# Patient Record
Sex: Male | Born: 1975 | Race: Black or African American | Hispanic: No | Marital: Married | Smoking: Never smoker
Health system: Southern US, Community
[De-identification: ages and names within clinical notes are randomized; demographics above are authoritative.]

## PROBLEM LIST (undated history)

## (undated) DIAGNOSIS — I5020 Unspecified systolic (congestive) heart failure: Secondary | ICD-10-CM

## (undated) DIAGNOSIS — J9600 Acute respiratory failure, unspecified whether with hypoxia or hypercapnia: Secondary | ICD-10-CM

## (undated) DIAGNOSIS — R55 Syncope and collapse: Secondary | ICD-10-CM

## (undated) DIAGNOSIS — I517 Cardiomegaly: Secondary | ICD-10-CM

## (undated) DIAGNOSIS — I340 Nonrheumatic mitral (valve) insufficiency: Secondary | ICD-10-CM

## (undated) DIAGNOSIS — I2510 Atherosclerotic heart disease of native coronary artery without angina pectoris: Secondary | ICD-10-CM

## (undated) DIAGNOSIS — Z951 Presence of aortocoronary bypass graft: Secondary | ICD-10-CM

## (undated) DIAGNOSIS — I255 Ischemic cardiomyopathy: Secondary | ICD-10-CM

## (undated) HISTORY — DX: Cardiomegaly: I51.7

## (undated) HISTORY — DX: Acute respiratory failure, unspecified whether with hypoxia or hypercapnia: J96.00

## (undated) HISTORY — PX: TONSILLECTOMY: SUR1361

## (undated) HISTORY — DX: Nonrheumatic mitral (valve) insufficiency: I34.0

---

## 2009-10-20 ENCOUNTER — Inpatient Hospital Stay (HOSPITAL_COMMUNITY): Admission: EM | Admit: 2009-10-20 | Discharge: 2009-10-20 | Payer: Self-pay | Admitting: Emergency Medicine

## 2010-10-06 LAB — BASIC METABOLIC PANEL
BUN: 10 mg/dL (ref 6–23)
CO2: 27 mEq/L (ref 19–32)
Chloride: 102 mEq/L (ref 96–112)
Creatinine, Ser: 1.14 mg/dL (ref 0.4–1.5)
Glucose, Bld: 118 mg/dL — ABNORMAL HIGH (ref 70–99)
Potassium: 4 mEq/L (ref 3.5–5.1)

## 2010-10-06 LAB — DIFFERENTIAL
Basophils Absolute: 0.1 10*3/uL (ref 0.0–0.1)
Basophils Relative: 1 % (ref 0–1)
Eosinophils Absolute: 0.4 10*3/uL (ref 0.0–0.7)
Eosinophils Relative: 3 % (ref 0–5)
Lymphs Abs: 4.9 10*3/uL — ABNORMAL HIGH (ref 0.7–4.0)
Neutrophils Relative %: 52 % (ref 43–77)

## 2010-10-06 LAB — CBC
HCT: 46.4 % (ref 39.0–52.0)
MCHC: 33 g/dL (ref 30.0–36.0)
MCV: 81.1 fL (ref 78.0–100.0)
Platelets: 323 10*3/uL (ref 150–400)
RDW: 14.1 % (ref 11.5–15.5)
WBC: 13.5 10*3/uL — ABNORMAL HIGH (ref 4.0–10.5)

## 2017-07-28 DIAGNOSIS — Z951 Presence of aortocoronary bypass graft: Secondary | ICD-10-CM

## 2017-07-28 HISTORY — DX: Presence of aortocoronary bypass graft: Z95.1

## 2017-07-28 HISTORY — PX: CORONARY ARTERY BYPASS GRAFT: SHX141

## 2017-08-16 ENCOUNTER — Encounter: Payer: Self-pay | Admitting: Interventional Cardiology

## 2017-08-17 ENCOUNTER — Ambulatory Visit (INDEPENDENT_AMBULATORY_CARE_PROVIDER_SITE_OTHER): Payer: Managed Care, Other (non HMO) | Admitting: Interventional Cardiology

## 2017-08-17 ENCOUNTER — Encounter: Payer: Self-pay | Admitting: Interventional Cardiology

## 2017-08-17 ENCOUNTER — Encounter (INDEPENDENT_AMBULATORY_CARE_PROVIDER_SITE_OTHER): Payer: Self-pay

## 2017-08-17 VITALS — BP 90/68 | HR 101 | Ht 74.0 in | Wt 219.2 lb

## 2017-08-17 DIAGNOSIS — I255 Ischemic cardiomyopathy: Secondary | ICD-10-CM

## 2017-08-17 DIAGNOSIS — E785 Hyperlipidemia, unspecified: Secondary | ICD-10-CM

## 2017-08-17 DIAGNOSIS — D649 Anemia, unspecified: Secondary | ICD-10-CM | POA: Diagnosis not present

## 2017-08-17 DIAGNOSIS — I5022 Chronic systolic (congestive) heart failure: Secondary | ICD-10-CM

## 2017-08-17 DIAGNOSIS — I25119 Atherosclerotic heart disease of native coronary artery with unspecified angina pectoris: Secondary | ICD-10-CM

## 2017-08-17 DIAGNOSIS — I998 Other disorder of circulatory system: Secondary | ICD-10-CM | POA: Diagnosis not present

## 2017-08-17 MED ORDER — SACUBITRIL-VALSARTAN 24-26 MG PO TABS: 1.0000 | ORAL_TABLET | Freq: Two times a day (BID) | ORAL | 11 refills | Status: DC

## 2017-08-17 NOTE — Progress Notes (Addendum)
Cardiology Office Note    Date:  08/18/2017   ID:  Brad Lee, DOB 01/03/1976, MRN [id number]  PCP:  Henrine Screwshacker, Robert, MD  Cardiologist: Lesleigh NoeHenry W Smith III, MD   Chief Complaint  Patient presents with  . Coronary Artery Disease    Recent acute coronary syndrome [name]    History of Present Illness:  Brad Lee is a 42 y.o. 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  His primary physician is Dr. Henrine Screwsobert Thacker.  The patient's up for an upper respiratory illness in late December.  Was prescribed a Z-Pak and several days later began experiencing "indigestion".  He experienced severe discomfort associated with nausea, vomiting, and diaphoresis on the morning of admission to the hospital.  He contacted EMS and was [facility].  He was felt to be suffering an ST elevation myocardial infarction and was taken directly to the catheterization laboratory where the LAD was found to be totally occluded and there was high-grade disease noted in the circumflex and right coronary.  Attempts to open the LAD were unsuccessful and subsequently felt to be chronically totally occluded (CTO).  High-grade obstruction in the very distal RCA before the PDA was noted.  The RCA was felt to supply collaterals to the anterior wall.  Angioplasty using a 1.2 mm balloon was performed on the tight distal RCA stenosis with improvement in symptoms.  A stent was not placed.  Later that day the patient had recurrence of symptoms, nausea, and dyspnea.  He was taken back to the Cath Lab where anatomy was unchanged and subsequently a balloon pump was placed.  At this point it was noted that his cardiac markers revealed a troponin greater than 65.  He was seen by cardiac surgery who felt he should be cooled off for 72  hours before coronary bypass grafting.  CABG was performed on 07/28/2017 with LIMA to LAD, SVG to diagonal, SVG to OM, and sequential SVG to PDA and PL.  A cutdown was performed on the left femoral artery for removal of the previously placed balloon pump and reinsertion to allow weaning from bypass.  Recent LVEF by echo performed on 07/29/17 was 45%.  A transesophageal echo performed on 08/06/2017 to rule out vegetations revealed an EF of 30-35%.  He is currently taking furosemide 20 mg/day, metoprolol succinate 50 mg/day, colchicine 0.6 mg/day, aspirin 81 mg/day, lisinopril 2.5 mg/day along with Zyrtec 10 mg/day, and famotidine 10 mg ingestion.  Medication list as outlined below is incorrect and will be updated.  He presents today to establish for longitudinal cardiology care and is accompanied by his wife.  He denies dyspnea.  He denies chest pain.  He has concerns about his left thigh.  He denies chest/sternal clicking.  There is no peripheral edema.  He has one pillow orthopnea.  He has not had palpitations  Past Medical History:  Diagnosis Date  . LVH (left ventricular hypertrophy)   . Mild bibasilar atelectasis    and/or pneumonia  . Mitral regurgitation   . Respiratory failure, acute (HCC)   . 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 STEMI (ST elevation myocardial infarction) (HCC) 07/25/2017   [place] [facility] [place]    Past Surgical History:  Procedure Laterality Date  . CORONARY ARTERY BYPASS GRAFT  07/28/2017  .  TONSILLECTOMY      Current Medications: No facility-administered medications prior to visit.    Outpatient Medications Prior to Visit  Medication Sig Dispense Refill  . albuterol (PROVENTIL HFA;VENTOLIN HFA) 108 (90 Base) MCG/ACT inhaler Inhale 2 puffs into the lungs every 6 (six) hours as needed for wheezing or shortness of breath.    Marland Kitchen atorvastatin (LIPITOR) 40 MG tablet Take 40 mg by mouth daily.    . calcium carbonate (TUMS - DOSED IN MG ELEMENTAL CALCIUM) 500 MG chewable  tablet Chew 1 tablet by mouth daily as needed for indigestion or heartburn.     . colchicine 0.6 MG tablet Take 0.6 mg by mouth daily.    . famotidine (PEPCID) 10 MG tablet Take 10 mg by mouth daily as needed for heartburn or indigestion.     Marland Kitchen aspirin 81 MG tablet Take 81 mg by mouth daily.    Marland Kitchen lisinopril (PRINIVIL,ZESTRIL) 2.5 MG tablet Take 2.5 mg by mouth daily.    . Metoprolol-Hydrochlorothiazide 50-12.5 MG TB24 Take 1 tablet by mouth daily.     . cetirizine (ZYRTEC) 10 MG tablet Take 10 mg by mouth daily as needed for allergies.     . fluticasone (FLONASE) 50 MCG/ACT nasal spray Place 1 spray into both nostrils daily as needed for allergies.     . Multiple Vitamin (MULTIVITAMIN) tablet Take 1 tablet by mouth daily.    . benzonatate (TESSALON) 200 MG capsule Take 200 mg by mouth 3 (three) times daily.       Allergies:   Augmentin [amoxicillin-pot clavulanate]   Social History   Socioeconomic History  . Marital status: Married    Spouse name: None  . Number of children: 0  . Years of education: None  . Highest education level: None  Social Needs  . Financial resource strain: None  . Food insecurity - worry: None  . Food insecurity - inability: None  . Transportation needs - medical: None  . Transportation needs - non-medical: None  Occupational History  . Occupation: OPERATIONS MANAGER  Tobacco Use  . Smoking status: Never Smoker  . Smokeless tobacco: Never Used  Substance and Sexual Activity  . Alcohol use: No    Frequency: Never  . Drug use: No  . Sexual activity: Not Currently  Other Topics Concern  . None  Social History Narrative  . None     Family History:  The patient's family history includes Healthy in his father and mother.   ROS:   Please see the history of present illness.    Sinus trouble, some difficulty with vision since surgery, unexplained weight loss, wrist appetite, fatigue, but otherwise no complaints.  He has not noticed any unusual drainage  from the left lower extremity vein graft harvest site.  He still has some drainage from the mediastinal to site which still has a Tegaderm bandage in place that is quite and clean. All other systems reviewed and are negative.   PHYSICAL EXAM:   VS:  BP 90/68   Pulse (!) 101   Ht 6\' 2"  (1.88 m)   Wt 219 lb 3.2 oz (99.4 kg)   BMI 28.14 kg/m    Asymmetric blood pressure: 122/82 mmHg right arm 90/68 mmHg left arm GEN: Well nourished, well developed, in no acute distress  HEENT: normal  Neck: no JVD, carotid bruits, or masses Cardiac: Relatively rapid, regular heart rate.  RRR; no murmurs, rubs, or edema.  A summation gallop is audible. Respiratory:  clear to auscultation bilaterally,  normal work of breathing GI: soft, nontender, nondistended, + BS MS: no deformity or atrophy  Skin: warm and dry, no rash Neuro:  Alert and Oriented x 3, Strength and sensation are intact Psych: euthymic mood, full affect  Wt Readings from Last 3 Encounters:  08/18/17 219 lb (99.3 kg)  08/17/17 219 lb 3.2 oz (99.4 kg)      Studies/Labs Reviewed:   EKG:  EKG sinus tachycardia at 101 bpm, biatrial abnormality, evidence of a large anterolateral Q wave infarction involving V1 through V5 as well as 1 and aVL.  When [facility] ST segment elevation 2 3 aVF V4 through V6 has resolved.  Q waves are unchanged  Recent Labs: 08/18/2017: ALT 30; B Natriuretic Peptide 201.0; BUN 12; Creatinine, Ser 1.07; Hemoglobin 13.4; Magnesium 2.1; Platelets 335; Potassium 4.0; Sodium 137   Lipid Panel No results found for: CHOL, TRIG, HDL, CHOLHDL, VLDL, LDLCALC, LDLDIRECT  Additional studies/ records that were reviewed today include:  [facility] from [facility]:  Carotid duplex and Doppler study preop for surgery: No significant obstruction/less than 50% bilateral extracranial carotid arteries.  Echocardiogram 07/25/2017 EF 50% with  anterolateral and apical hypokinesis.    Echocardiogram 07/27/2017 with EF 55-60% hypokinesis of the basal apical walls.    Echocardiogram 07/29/17 EF 40-45% with apical and apical hypokinesis.  Cardiac cath 07/25/2017: Severe multivessel coronary disease with 70% ostial and chronic total occlusion of the mid LAD.  No visible diagonals were noted.  Circumflex is felt to be small after the first obtuse marginal origin.  First obtuse marginal 90% ostial proximal narrowing and there is 90% obstruction in the inferior limb after bifurcating RCA is diffusely diseased 50% dated 70% RV marginal, and distal 99% RCA proximal to the PDA and LV branch.  Normal left ventricular ejection fraction of 50-60% with LVEDP 24 mmHg.  1.2 mm balloon angioplasty of the distal right coronary was performed.  Coronary bypass grafting on 07/28/2017: LIMA to LAD, SVG to diagonal, SVG to OM1, sequential SVG to PDA and posterolateral.  Also mention of left groin exploration by cutdown for removal of clotted/static intra-aortic balloon pump and sheath with placement of new sheath and balloon pump through open groin.  Transesophageal echocardiography performed on 08/10/2017 to rule out valvular vegetation demonstrated EF 30-35% with diffuse hypokinesis, no other significant findings were mentioned.  08/14/17 labs drawn at [facility] since return to [facility] revealed a low iron saturation sent with total iron of 42, mildly elevated alkaline phosphatase at 159.  The BUN and creatinine 13 and 1.19 with normal blood sugar.  Potassium 5.1.  Hemoglobin 13.5.  Platelet count 593,000.    ASSESSMENT:    1. Coronary artery disease involving native coronary artery of native heart with angina pectoris (HCC)   2. Ischemic cardiomyopathy   3. Asymmetric blood pressures   4. Chronic systolic heart failure (HCC)   5. Hyperlipidemia with target LDL less than 70   6. Postoperative anemia      PLAN:  In order of problems listed  above:  1. Multivessel disease was revascularized with coronary bypass grafting x5 as outlined above.  No ongoing symptoms of angina. 2. Most recent LVEF is 30-35% obtained when a transesophageal echocardiogram was done on 08/06/2017.  No valvular abnormalities were noted at that time.  Stop wearing a LifeVest because of frequent warnings that shocked. 3. Medication adjustment should be based upon right arm blood pressures.  Will  need to have vascular ultrasound evaluation of left subclavian to exclude obstructive disease or trauma related to prior intra-aortic balloon pump insertion recently. 4. LV dysfunction is related to ischemic heart disease.  Upon presentation with acute coronary syndrome EF was greater than 50%.  The resultant EF follows multivessel coronary bypass grafting.  Will restructure the patient's current medication regimen by continuing metoprolol succinate 50 mg/day, we will discontinue lisinopril, we will start Entresto 24/26 mg twice daily in 24 hours.  Furosemide 20 mg/day will be continued.  A comprehensive metabolic panel will be obtained in 1 week after initiating therapy.  Lisinopril was discontinued as of last evening, nearly 24 hours ago. 5. Continue Lipitor 40 mg/day.  On return a comprehensive metabolic panel and lipid panel along with hemoglobin A1c will be obtained. 6. Based upon laboratory data drawn by Dr. Abigail Miyamoto on 08/14/2017, anemia is no longer a significant problem.  Hemoglobin was greater than 13.  Very complex situation in this young gentleman.  I am very concerned about the reduction in LVEF.  We will alter the medical regimen to exclude a strong guideline based regimen to preserve LV function.  On next visit, colchicine will likely be discontinued.  There is no clinical evidence of pericarditis.  The patient will start the cardiac rehab program at [facility] within the next week.  Depending upon how he responds to medical therapy, may need to have an  advanced heart failure clinic evaluation.  We need to exclude risk factors that could have cause premature coronary disease at such a young age.  If LVEF remains less than 35% after 90 days of guideline based optimized heart failure therapy, may need to consider defibrillator.  Medication Adjustments/Labs and Tests Ordered: Current medicines are reviewed at length with the patient today.  Concerns regarding medicines are outlined above.  Medication changes, Labs and Tests ordered today are listed in the Patient Instructions below. Patient Instructions  Medication Instructions:  1) DISCONTINUE Lisinopril 2) Tomorrow night, start Entresto 24/26mg  twice daily  Labwork: Your physician recommends that you return for lab work in: 1 week (Lipid, CMET, A1C)   Testing/Procedures: None  Follow-Up: Your physician recommends that you schedule a follow-up appointment in: 1 week with Dr. Katrinka Blazing.    Any Other Special Instructions Will Be Listed Below (If Applicable).     If you need a refill on your cardiac medications before your next appointment, please call your pharmacy.      Signed, Lesleigh Noe, MD  08/18/2017 1:33 PM    [facility] [address] [place], [place], [place]  [address] Phone: [phone]; Fax: [phone]

## 2017-08-17 NOTE — Patient Instructions (Signed)
Medication Instructions:  1) DISCONTINUE Lisinopril 2) Tomorrow night, start Entresto 24/26mg  twice daily  Labwork: Your physician recommends that you return for lab work in: 1 week (Lipid, CMET, A1C)   Testing/Procedures: None  Follow-Up: Your physician recommends that you schedule a follow-up appointment in: 1 week with Dr. Katrinka BlazingSmith.    Any Other Special Instructions Will Be Listed Below (If Applicable).     If you need a refill on your cardiac medications before your next appointment, please call your pharmacy.

## 2017-08-18 ENCOUNTER — Other Ambulatory Visit: Payer: Self-pay

## 2017-08-18 ENCOUNTER — Encounter (HOSPITAL_COMMUNITY): Payer: Self-pay | Admitting: Medical

## 2017-08-18 ENCOUNTER — Emergency Department (HOSPITAL_COMMUNITY): Payer: Managed Care, Other (non HMO)

## 2017-08-18 ENCOUNTER — Telehealth: Payer: Self-pay | Admitting: Interventional Cardiology

## 2017-08-18 ENCOUNTER — Other Ambulatory Visit: Payer: Managed Care, Other (non HMO)

## 2017-08-18 ENCOUNTER — Observation Stay (HOSPITAL_COMMUNITY)
Admission: EM | Admit: 2017-08-18 | Discharge: 2017-08-19 | Disposition: A | Discharge status: Home / Self Care | Payer: Managed Care, Other (non HMO) | Attending: Cardiology | Admitting: Cardiology

## 2017-08-18 DIAGNOSIS — E785 Hyperlipidemia, unspecified: Secondary | ICD-10-CM | POA: Diagnosis not present

## 2017-08-18 DIAGNOSIS — Z951 Presence of aortocoronary bypass graft: Secondary | ICD-10-CM | POA: Diagnosis not present

## 2017-08-18 DIAGNOSIS — I25709 Atherosclerosis of coronary artery bypass graft(s), unspecified, with unspecified angina pectoris: Secondary | ICD-10-CM | POA: Insufficient documentation

## 2017-08-18 DIAGNOSIS — I5022 Chronic systolic (congestive) heart failure: Secondary | ICD-10-CM | POA: Diagnosis not present

## 2017-08-18 DIAGNOSIS — I214 Non-ST elevation (NSTEMI) myocardial infarction: Secondary | ICD-10-CM | POA: Diagnosis not present

## 2017-08-18 DIAGNOSIS — D649 Anemia, unspecified: Secondary | ICD-10-CM | POA: Insufficient documentation

## 2017-08-18 DIAGNOSIS — R740 Nonspecific elevation of levels of transaminase and lactic acid dehydrogenase [LDH]: Secondary | ICD-10-CM | POA: Insufficient documentation

## 2017-08-18 DIAGNOSIS — R61 Generalized hyperhidrosis: Secondary | ICD-10-CM | POA: Insufficient documentation

## 2017-08-18 DIAGNOSIS — R55 Syncope and collapse: Secondary | ICD-10-CM | POA: Diagnosis not present

## 2017-08-18 DIAGNOSIS — I4589 Other specified conduction disorders: Secondary | ICD-10-CM | POA: Diagnosis not present

## 2017-08-18 DIAGNOSIS — Z7982 Long term (current) use of aspirin: Secondary | ICD-10-CM | POA: Diagnosis not present

## 2017-08-18 DIAGNOSIS — I255 Ischemic cardiomyopathy: Secondary | ICD-10-CM | POA: Insufficient documentation

## 2017-08-18 DIAGNOSIS — I959 Hypotension, unspecified: Secondary | ICD-10-CM

## 2017-08-18 DIAGNOSIS — Z79899 Other long term (current) drug therapy: Secondary | ICD-10-CM | POA: Insufficient documentation

## 2017-08-18 DIAGNOSIS — D72829 Elevated white blood cell count, unspecified: Secondary | ICD-10-CM | POA: Diagnosis not present

## 2017-08-18 DIAGNOSIS — I5020 Unspecified systolic (congestive) heart failure: Secondary | ICD-10-CM | POA: Insufficient documentation

## 2017-08-18 DIAGNOSIS — I2510 Atherosclerotic heart disease of native coronary artery without angina pectoris: Secondary | ICD-10-CM | POA: Insufficient documentation

## 2017-08-18 DIAGNOSIS — I5042 Chronic combined systolic (congestive) and diastolic (congestive) heart failure: Secondary | ICD-10-CM | POA: Diagnosis present

## 2017-08-18 DIAGNOSIS — I998 Other disorder of circulatory system: Secondary | ICD-10-CM | POA: Insufficient documentation

## 2017-08-18 HISTORY — DX: Unspecified systolic (congestive) heart failure: I50.20

## 2017-08-18 HISTORY — DX: Atherosclerotic heart disease of native coronary artery without angina pectoris: I25.10

## 2017-08-18 HISTORY — DX: Presence of aortocoronary bypass graft: Z95.1

## 2017-08-18 HISTORY — DX: Ischemic cardiomyopathy: I25.5

## 2017-08-18 HISTORY — DX: Syncope and collapse: R55

## 2017-08-18 LAB — I-STAT CG4 LACTIC ACID, ED
LACTIC ACID, VENOUS: 3.05 mmol/L — AB (ref 0.5–1.9)
Lactic Acid, Venous: 2.31 mmol/L (ref 0.5–1.9)
Lactic Acid, Venous: 2.65 mmol/L (ref 0.5–1.9)

## 2017-08-18 LAB — COMPREHENSIVE METABOLIC PANEL
ALT: 30 U/L (ref 17–63)
AST: 31 U/L (ref 15–41)
Albumin: 3.6 g/dL (ref 3.5–5.0)
Alkaline Phosphatase: 148 U/L — ABNORMAL HIGH (ref 38–126)
Anion gap: 14 (ref 5–15)
BILIRUBIN TOTAL: 0.7 mg/dL (ref 0.3–1.2)
BUN: 11 mg/dL (ref 6–20)
CO2: 19 mmol/L — ABNORMAL LOW (ref 22–32)
CREATININE: 1.17 mg/dL (ref 0.61–1.24)
Calcium: 9.3 mg/dL (ref 8.9–10.3)
Chloride: 102 mmol/L (ref 101–111)
Glucose, Bld: 112 mg/dL — ABNORMAL HIGH (ref 65–99)
POTASSIUM: 4.2 mmol/L (ref 3.5–5.1)
Sodium: 135 mmol/L (ref 135–145)
TOTAL PROTEIN: 8.1 g/dL (ref 6.5–8.1)

## 2017-08-18 LAB — CBC
HCT: 41.8 % (ref 39.0–52.0)
HEMOGLOBIN: 13.4 g/dL (ref 13.0–17.0)
MCH: 25.4 pg — AB (ref 26.0–34.0)
MCHC: 32.1 g/dL (ref 30.0–36.0)
MCV: 79.2 fL (ref 78.0–100.0)
Platelets: 335 10*3/uL (ref 150–400)
RBC: 5.28 MIL/uL (ref 4.22–5.81)
RDW: 15.6 % — ABNORMAL HIGH (ref 11.5–15.5)
WBC: 8.8 10*3/uL (ref 4.0–10.5)

## 2017-08-18 LAB — CBC WITH DIFFERENTIAL/PLATELET
BASOS ABS: 0 10*3/uL (ref 0.0–0.1)
Basophils Relative: 0 %
Eosinophils Absolute: 0.6 10*3/uL (ref 0.0–0.7)
Eosinophils Relative: 5 %
HCT: 46.5 % (ref 39.0–52.0)
Hemoglobin: 14.9 g/dL (ref 13.0–17.0)
LYMPHS ABS: 1.6 10*3/uL (ref 0.7–4.0)
Lymphocytes Relative: 14 %
MCH: 25.4 pg — ABNORMAL LOW (ref 26.0–34.0)
MCHC: 32 g/dL (ref 30.0–36.0)
MCV: 79.4 fL (ref 78.0–100.0)
MONO ABS: 0.3 10*3/uL (ref 0.1–1.0)
Monocytes Relative: 3 %
Neutro Abs: 8.9 10*3/uL — ABNORMAL HIGH (ref 1.7–7.7)
Neutrophils Relative %: 78 %
PLATELETS: ADEQUATE 10*3/uL (ref 150–400)
RBC: 5.86 MIL/uL — AB (ref 4.22–5.81)
RDW: 15.8 % — AB (ref 11.5–15.5)
WBC: 11.4 10*3/uL — AB (ref 4.0–10.5)

## 2017-08-18 LAB — I-STAT CHEM 8, ED
BUN: 12 mg/dL (ref 6–20)
Calcium, Ion: 1.04 mmol/L — ABNORMAL LOW (ref 1.15–1.40)
Chloride: 105 mmol/L (ref 101–111)
Creatinine, Ser: 1.1 mg/dL (ref 0.61–1.24)
Glucose, Bld: 116 mg/dL — ABNORMAL HIGH (ref 65–99)
HEMATOCRIT: 47 % (ref 39.0–52.0)
HEMOGLOBIN: 16 g/dL (ref 13.0–17.0)
Potassium: 4 mmol/L (ref 3.5–5.1)
SODIUM: 137 mmol/L (ref 135–145)
TCO2: 21 mmol/L — AB (ref 22–32)

## 2017-08-18 LAB — TROPONIN I
TROPONIN I: 0.06 ng/mL — AB (ref <0.03)
Troponin I: 0.06 ng/mL (ref <0.03)

## 2017-08-18 LAB — I-STAT TROPONIN, ED: TROPONIN I, POC: 0.05 ng/mL (ref 0.00–0.08)

## 2017-08-18 LAB — CREATININE, SERUM
CREATININE: 1.07 mg/dL (ref 0.61–1.24)
GFR calc Af Amer: >60 mL/min (ref >60)
GFR calc non Af Amer: >60 mL/min (ref >60)

## 2017-08-18 LAB — BRAIN NATRIURETIC PEPTIDE: B NATRIURETIC PEPTIDE 5: 201 pg/mL — AB (ref 0.0–100.0)

## 2017-08-18 LAB — MAGNESIUM: MAGNESIUM: 2.1 mg/dL (ref 1.7–2.4)

## 2017-08-18 LAB — GLUCOSE, CAPILLARY: GLUCOSE-CAPILLARY: 98 mg/dL (ref 65–99)

## 2017-08-18 MED ORDER — ENOXAPARIN SODIUM 40 MG/0.4ML ~~LOC~~ SOLN
40.0000 mg | SUBCUTANEOUS | Status: DC
  Administered 2017-08-18: 40 mg via SUBCUTANEOUS
  Filled 2017-08-18: qty 0.4

## 2017-08-18 MED ORDER — SODIUM CHLORIDE 0.9 % IV BOLUS (SEPSIS)
1000.0000 mL | Freq: Once | INTRAVENOUS | Status: AC
Start: 2017-08-18 — End: 2017-08-18
  Administered 2017-08-18: 1000 mL via INTRAVENOUS

## 2017-08-18 MED ORDER — IOPAMIDOL (ISOVUE-370) INJECTION 76%
INTRAVENOUS | Status: AC
  Administered 2017-08-18: 75 mL via INTRAVENOUS
  Filled 2017-08-18: qty 100

## 2017-08-18 MED ORDER — SODIUM CHLORIDE 0.9 % IV BOLUS (SEPSIS)
1000.0000 mL | Freq: Once | INTRAVENOUS | Status: AC
  Administered 2017-08-18: 1000 mL via INTRAVENOUS

## 2017-08-18 MED ORDER — ASPIRIN 81 MG PO CHEW
81.0000 mg | CHEWABLE_TABLET | Freq: Every day | ORAL | Status: DC
  Administered 2017-08-18 – 2017-08-19 (×2): 81 mg via ORAL
  Filled 2017-08-18 (×2): qty 1

## 2017-08-18 MED ORDER — METOPROLOL SUCCINATE ER 50 MG PO TB24
50.0000 mg | ORAL_TABLET | Freq: Every day | ORAL | Status: DC
  Administered 2017-08-18 – 2017-08-19 (×2): 50 mg via ORAL
  Filled 2017-08-18 (×3): qty 1

## 2017-08-18 MED ORDER — ATORVASTATIN CALCIUM 40 MG PO TABS: 40.0000 mg | ORAL_TABLET | Freq: Every day | ORAL | Status: DC

## 2017-08-18 MED ORDER — FLUTICASONE PROPIONATE 50 MCG/ACT NA SUSP
1.0000 | Freq: Every day | NASAL | Status: DC | PRN
  Filled 2017-08-18: qty 16

## 2017-08-18 MED ORDER — CALCIUM CARBONATE ANTACID 500 MG PO CHEW
1.0000 | CHEWABLE_TABLET | Freq: Every day | ORAL | Status: DC | PRN
  Administered 2017-08-18: 200 mg via ORAL
  Filled 2017-08-18: qty 1

## 2017-08-18 MED ORDER — ENOXAPARIN SODIUM 40 MG/0.4ML ~~LOC~~ SOLN: 40.0000 mg | SUBCUTANEOUS | Status: DC

## 2017-08-18 MED ORDER — SODIUM CHLORIDE 0.9% FLUSH
3.0000 mL | Freq: Two times a day (BID) | INTRAVENOUS | Status: DC
  Administered 2017-08-18: 3 mL via INTRAVENOUS

## 2017-08-18 MED ORDER — COLCHICINE 0.6 MG PO TABS
0.6000 mg | ORAL_TABLET | Freq: Every day | ORAL | Status: DC
  Administered 2017-08-18 – 2017-08-19 (×2): 0.6 mg via ORAL
  Filled 2017-08-18 (×2): qty 1

## 2017-08-18 MED ORDER — LORATADINE 10 MG PO TABS
10.0000 mg | ORAL_TABLET | Freq: Every day | ORAL | Status: DC
  Administered 2017-08-18 – 2017-08-19 (×2): 10 mg via ORAL
  Filled 2017-08-18 (×2): qty 1

## 2017-08-18 MED ORDER — ONE-DAILY MULTI VITAMINS PO TABS: 1.0000 | ORAL_TABLET | Freq: Every day | ORAL | Status: DC

## 2017-08-18 MED ORDER — ADULT MULTIVITAMIN W/MINERALS CH
1.0000 | ORAL_TABLET | Freq: Every day | ORAL | Status: DC
  Administered 2017-08-18: 1 via ORAL
  Filled 2017-08-18 (×2): qty 1

## 2017-08-18 MED ORDER — FAMOTIDINE 20 MG PO TABS: 10.0000 mg | ORAL_TABLET | Freq: Every day | ORAL | Status: DC | PRN

## 2017-08-18 NOTE — Consult Note (Addendum)
Cardiology Consultation:   Patient ID: Brad Lee; [id number]; 05/02/1976   Admit date: 08/18/2017 Date of Consult: 08/18/2017  Primary Care Provider: Henrine Screwshacker, Robert, MD Primary Cardiologist: Dr. Katrinka BlazingSmith (last office visit yesterday 08/17/2017) Primary Electrophysiologist:  New, Dr Elberta Fortisamnitz   Patient Profile:   Brad Lee is a 42 y.o. male with a PMH of a recent STEMI 07/25/2017 s/p CABG x5 on 07/28/2017 while on a work trip in [place] and ischemic cardiomyopathy with EF of 30-35% by TEE on 08/11/2107  who is being seen today for the evaluation of syncope at the request of Dr. Jens Somrenshaw.  History of Present Illness:   Brad Lee has been recovering well from his STEMI until this morning when he felt weak and went to go to the bathroom. After having diarrhea, he had cold sweats, chills, and nausea. He then passed out for a few seconds while on the toilet. His wife caught him so he did not fall. His wife then called 911.   EMS came and assisted him down the steps where he then sat in a chair, felt like he had to have another bowel movement, and then and passed out again for a few seconds. He was found to be hypotensive while en route to the hospital.   His blood pressure was 73/55 upon arrival to the ED and he was tachycardic. Patient was given IV fluids. EKG showed sinus tachycardia with diffuse T wave abnormalities and large Q waves in anterolateral leads. No ST elevation or depression. Troponin 0.05. BNP 201. CTA negative for PE.   **He was prescribed a LifeVest upon discharge [facility] but has not been wearing it was not functioning correctly. The alarm kept going off as if it was about to shock him. His wife called the company and she was told that the LifeVest was reporting his heart rate is in the 300s but when his wife checked his pulse it is at most 109. The company reportedly told his wife that he may not be a good candidate for the LifeVest.  He saw Dr. Katrinka BlazingSmith yesterday and was  doing well at that time. Dr. Katrinka BlazingSmith discontinued the Lisinopril and switched him [facility]26[facility]  BID. His first dose of Entresto is supposed to be tonight after the 36 hour washout period.   Prior to his STEMI, he was healthy. No history of HTN, HLD, or DM. No family history of premature CAD.   Past Medical History:  Diagnosis Date  . LVH (left ventricular hypertrophy)   . Mild bibasilar atelectasis    and/or pneumonia  . Mitral regurgitation   . Respiratory failure, acute (HCC)   . 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 STEMI (ST elevation myocardial infarction) (HCC) 07/25/2017   [place] [facility] [facility]    Past Surgical History:  Procedure Laterality Date  . CORONARY ARTERY BYPASS GRAFT  07/28/2017  . TONSILLECTOMY       Prior to Admission medications   Medication Sig Start Date End Date Taking? Authorizing Provider  albuterol (PROVENTIL HFA;VENTOLIN HFA) 108 (90 Base) MCG/ACT inhaler Inhale 2 puffs into the lungs every 6 (six) hours as needed for wheezing or shortness of breath.   Yes [provider]  aspirin 81 MG chewable tablet Chew 81 mg by mouth daily.   Yes [provider]  atorvastatin (LIPITOR) 40 MG tablet Take 40 mg by mouth daily.   Yes [provider]  calcium carbonate (TUMS - DOSED IN MG ELEMENTAL CALCIUM) 500 MG  chewable tablet Chew 1 tablet by mouth daily as needed for indigestion or heartburn.    Yes [provider]  cetirizine (ZYRTEC) 10 MG tablet Take 10 mg by mouth daily as needed for allergies.    Yes [provider]  colchicine 0.6 MG tablet Take 0.6 mg by mouth daily.   Yes [provider]  famotidine (PEPCID) 10 MG tablet Take 10 mg by mouth daily as needed for heartburn or indigestion.    Yes [provider]  fluticasone (FLONASE) 50 MCG/ACT nasal spray Place 1 spray into both nostrils daily as needed for allergies.    Yes [provider]  furosemide (LASIX) 20 MG tablet Take 20 mg by mouth  daily.   Yes [provider]  metoprolol succinate (TOPROL-XL) 50 MG 24 hr tablet Take 50 mg by mouth daily. Take with or immediately following a meal.   Yes [provider]  Multiple Vitamin (MULTIVITAMIN) tablet Take 1 tablet by mouth daily.   Yes [provider]  sacubitril-valsartan (ENTRESTO) 24-26 MG Take 1 tablet by mouth 2 (two) times daily. Patient not taking: Reported on 08/18/2017 08/17/17   Lyn Records, MD    Inpatient Medications: Scheduled Meds: . aspirin  81 mg Oral Daily  . atorvastatin  40 mg Oral q1800  . colchicine  0.6 mg Oral Daily  . enoxaparin (LOVENOX) injection  40 mg Subcutaneous Q24H  . loratadine  10 mg Oral Daily  . metoprolol succinate  50 mg Oral Daily  . multivitamin  1 tablet Oral Daily  . sodium chloride flush  3 mL Intravenous Q12H   Continuous Infusions: . sodium chloride 1,000 mL (08/18/17 1141)   PRN Meds: calcium carbonate, famotidine, fluticasone  Allergies:    Allergies  Allergen Reactions  . Augmentin [Amoxicillin-Pot Clavulanate] Other (See Comments)    Increased heart rate    Social History:   Social History   Socioeconomic History  . Marital status: Married    Spouse name: Not on file  . Number of children: 0  . Years of education: Not on file  . Highest education level: Not on file  Social Needs  . Financial resource strain: Not on file  . Food insecurity - worry: Not on file  . Food insecurity - inability: Not on file  . Transportation needs - medical: Not on file  . Transportation needs - non-medical: Not on file  Occupational History  . Occupation: OPERATIONS MANAGER  Tobacco Use  . Smoking status: Never Smoker  . Smokeless tobacco: Never Used  Substance and Sexual Activity  . Alcohol use: No    Frequency: Never  . Drug use: No  . Sexual activity: Not Currently  Other Topics Concern  . Not on file  Social History Narrative  . Not on file    Family History:   Family History    Problem Relation Age of Onset  . Healthy Mother   . Healthy Father    Family Status:  Family Status  Relation Name Status  . Mother  Alive  . Father  Alive    ROS:  Please see the history of present illness.  All other ROS reviewed and negative.     Physical Exam/Data:   Vitals:   08/18/17 1015 08/18/17 1100 08/18/17 1130 08/18/17 1200  BP: 113/80 116/82 114/81   Pulse: (!) 111 (!) 111 (!) 113 (!) 110  Resp: 17 (!) 23 16 19   Temp:      TempSrc:  SpO2: 98% 98% 95% 100%  Weight:      Height:        Intake/Output Summary (Last 24 hours) at 08/18/2017 1251 Last data filed at 08/18/2017 0919 Gross per 24 hour  Intake -  Output 500 ml  Net -500 ml   Filed Weights   08/18/17 0601  Weight: 219 lb (99.3 kg)   Body mass index is 28.12 kg/m.  General:  42 y.o. African-American male who appears well nourished, well developed, and is lying in bed in no acute distress HEENT: normal Lymph: no adenopathy Neck: no JVD Endocrine:  No thryomegaly Vascular: No carotid bruits; 4/4 extremity pulses 2+, without bruits  Cardiac:  normal S1, S2; tachycardia with a regular rhythm; no murmurs appreciated; midline scar healing well  Lungs:  clear to auscultation bilaterally, no wheezing, rhonchi or rales  Abd: soft, nontender, no hepatomegaly  Ext: no edema Musculoskeletal:  No deformities, BUE and BLE strength normal and equal Skin: warm and dry  Neuro:  CNs 2-12 intact, no focal abnormalities noted Psych:  Normal affect   EKG:  The EKG was personally reviewed and demonstrates: sinus tachycardia, LAE, diffuse T wave abnormalities, large Q waves in anterolateral leads, no STE/D, QT 387, QTC 512. Telemetry:  Telemetry was personally reviewed and demonstrates:  Sinus tachycardia  Relevant CV Studies:  [facility] from [facility]:  Carotid duplex and Doppler study preop for surgery: No significant obstruction/less than 50% bilateral extracranial  carotid arteries.  Echocardiogram1/02/2018 EF 50% with anterolateral and apical hypokinesis.   Echocardiogram 07/27/2017 with EF 55-60% hypokinesis of the basal apical walls.   Echocardiogram 07/29/17 EF 40-45% with apical and apical hypokinesis.  Cardiac cath 07/25/2017: Severe multivessel coronary disease with 70% ostial and chronic total occlusion of the mid LAD.No visible diagonals were noted. Circumflex is felt to be small after the first obtuse marginal origin. First obtuse marginal 90% ostial proximal narrowing and there is 90% obstruction in the inferior limb after bifurcating RCA is diffusely diseased 50% dated 70% RV marginal, and distal 99% RCA proximal to the PDA and LV branch. Normal left ventricular ejection fraction of 50-60% with LVEDP 24 mmHg. 1.2 mm balloon angioplasty of the distal right coronary was performed.  Coronary bypass grafting on 07/28/2017: LIMA to LAD, SVG to diagonal, SVG to OM1, sequential SVG to PDA and posterolateral. Also mention of left groin exploration by cutdown for removal of clotted/static intra-aortic balloon pump and sheath with placement of new sheath and balloon pump through open groin.  Transesophageal echocardiography performed on 08/10/2017 to rule out valvular vegetation demonstrated EF 30-35% with diffuse hypokinesis, no other significant findings were mentioned.  Laboratory Data:  Chemistry Recent Labs  Lab 08/18/17 0610 08/18/17 0655 08/18/17 1154  NA 135 137  --   K 4.2 4.0  --   CL 102 105  --   CO2 19*  --   --   GLUCOSE 112* 116*  --   BUN 11 12  --   CREATININE 1.17 1.10 1.07  CALCIUM 9.3  --   --   GFRNONAA >60  --  >60  GFRAA >60  --  >60  ANIONGAP 14  --   --     Lab Results  Component Value Date   ALT 30 08/18/2017   AST 31 08/18/2017   ALKPHOS 148 (H) 08/18/2017   BILITOT 0.7 08/18/2017   Hematology Recent Labs  Lab 08/18/17 0610 08/18/17 0655 08/18/17 1154  WBC 11.4*  --  8.8  RBC 5.86*  --  5.28    HGB 14.9 16.0 13.4  HCT 46.5 47.0 41.8  MCV 79.4  --  79.2  MCH 25.4*  --  25.4*  MCHC 32.0  --  32.1  RDW 15.8*  --  15.6*  PLT PLATELET CLUMPS NOTED ON SMEAR, COUNT APPEARS ADEQUATE  --  335   Cardiac EnzymesNo results for input(s): TROPONINI in the last 168 hours.  Recent Labs  Lab 08/18/17 0600  TROPIPOC 0.05    BNP Recent Labs  Lab 08/18/17 0610  BNP 201.0*    Magnesium:  Magnesium  Date Value Ref Range Status  08/18/2017 2.1 1.7 - 2.4 mg/dL Final    Comment:    Performed at [facility], [address] [place], [place] [address]     Radiology/Studies:  Ct Angio Chest Pe W And/or Wo Contrast  Result Date: 08/18/2017 CLINICAL DATA:  Syncopal episode while using the bathroom. CABG approximately 3 weeks ago. EXAM: CT ANGIOGRAPHY CHEST WITH CONTRAST TECHNIQUE: Multidetector CT imaging of the chest was performed using the standard protocol during bolus administration of intravenous contrast. Multiplanar CT image reconstructions and MIPs were obtained to evaluate the vascular anatomy. CONTRAST:  <See Chart> ISOVUE-370 IOPAMIDOL (ISOVUE-370) INJECTION 76% COMPARISON:  Plain films of earlier today. FINDINGS: Cardiovascular: The quality of this exam for evaluation of pulmonary embolism is moderate. Limitations include mild motion and suboptimal bolus timing, with the bolus centered in the SVC. No pulmonary embolism to the lobar or large segmental level. Smaller emboli cannot be excluded. Normal aortic caliber. Mild cardiomegaly. Small pericardial effusion is likely physiologic. Status post median sternotomy. Retrosternal small volume relatively ill-defined edema and fluid, including on image 56/series 5. No well-defined fluid collection. Mediastinum/Nodes: No mediastinal or hilar adenopathy. Tiny hiatal hernia. Lungs/Pleura: Small left pleural effusion with minimal loculation including on image 106/series 5. No pneumothorax. Left worse than right base airspace disease. Upper  Abdomen: Normal imaged portions of the liver, spleen, right adrenal gland. Musculoskeletal: Mild bilateral gynecomastia. Review of the MIP images confirms the above findings. IMPRESSION: 1. Moderate quality exam for evaluation of pulmonary embolism. No pulmonary embolism with limitations above. 2. Small left pleural effusion with mild loculation. Bibasilar airspace disease. On the right, favored to represent atelectasis. On the left, this could represent atelectasis or mild infection. 3. Median sternotomy with ill-defined retrosternal fluid and edema, favored to be within normal variation in the postoperative state. No well-defined fluid collection. 4. Mild gynecomastia. 5.  Tiny hiatal hernia. Electronically Signed   By: Jeronimo Greaves M.D.   On: 08/18/2017 08:41   Dg Chest Port 1 View  Result Date: 08/18/2017 CLINICAL DATA:  Syncope and weakness. Recent coronary bypass surgery. EXAM: PORTABLE CHEST 1 VIEW COMPARISON:  None. FINDINGS: Postoperative changes in the mediastinum. Shallow inspiration. Cardiac enlargement. No vascular congestion or edema. No blunting of costophrenic angles. No pneumothorax. No focal consolidation or airspace disease. IMPRESSION: Cardiac enlargement.  No evidence of active pulmonary disease. Electronically Signed   By: Burman Nieves M.D.   On: 08/18/2017 06:41    Assessment and Plan:   Active Problems: 1. Syncope - Likely vasovagal given episode occurred after having a bowel movement - Patient was hypotensive (73/55) and tachycardic (102) upon arrival to ED. - Patient given 1L NS with improvement. Most recent BP 113/80.  - EKG showed sinus tachycardia with diffuse T wave abnormalities and large Q wave in anterolateral leads but no ST elevation/depression. - QT is minimally different  from 01/31 ECG where QT 378>>387, QTc 490>>512   2. CAD s/p CABG - Patient had a STEMI on 07/25/2017 while on a work trip in [place]. S/p CABG x5 (LIMA to LAD, SVG to diagonal, SVG to OM, and  sequential SVG to PDA and PL) at [facility].  - Continue Aspirin and Atorvastatin.   3. Ischemic Cardiomyopathy  - TEE 08/10/2017 showed EF of 30-35%.  - Patient does not appear volume overloaded on physical exam.  - Dr Jens Som is holding Lasix and Entresto - Repeat Echo after 3 months of medical therapy to evaluate for LVEF improvement.  - Patient was wearing a Life Vest upon discharge from [facility] but took it off because it was not functioning properly.  - Company contacted (spoke w/ Brad Lee), they need him to have the vest with him, then can send someone out to trouble-shoot it. Wife is aware, will bring it tomorrow.  For questions or updates, please contact [facility] Please consult [email or web address] for contact info under Cardiology/STEMI.   Signed, Corrin Parker, Student-PA Rhonda Barrett, PA-C  08/18/2017 12:51 PM  I have seen and examined this patient with Theodore Demark.  Agree with above, note added to reflect my findings.  On exam, RRR, no murmurs, lungs clear.  Return of hospital after an episode of syncope.  Syncope occurred  last night after an episode of diarrhea.  He had a second episode after that once EMS had arrived.  He had some shortness of breath and diaphoresis around the time of his syncopal episode.  He was belching quite a bit prior to that.  He does not have palpitations.  He feels well currently.  At this time, it appears that his episode of syncope was likely due to a vagal episode.  I do not feel that ICD implantation at this time is indicated.  He has been wearing a LifeVest, but it has apparently been double counting his T waves and sending off multiple alerts.  Will have life vest rep see him tomorrow for further evaluation of T wave oversensing. Would plan to increase VT zone to 200 bpm. Would lengthen VT detection to maximum.  Will M. Camnitz MD 08/18/2017 1:38 PM

## 2017-08-18 NOTE — ED Provider Notes (Signed)
[facility] EMERGENCY [facility] Provider Note   CSN: [id number] Arrival date & time: 08/18/17  0543     History   Chief Complaint Chief Complaint  Patient presents with  . Loss of Consciousness     x2    HPI Dannial MonarchJarrett Hackert is a 42 y.o. male.  Patient is a 42 year old male with history of recent [place].  The patient was there on business when he suffered a heart attack requiring bypass surgery emergently.  He was recovering normally until this evening.  He states he felt weak and had to go into the bathroom to have a bowel movement.  He reports having diarrhea, then experiencing a syncopal episode.  He stood up and attempted to walk, then apparently passed out a second time.  According to his wife he was unconscious for several seconds.  There was no seizure activity.  911 was called and the patient was transported here.  He was hypotensive in route and was given IV fluids.   The history is provided by the patient.  Loss of Consciousness   This is a new problem. The current episode started 1 to 2 hours ago. Episode frequency: Twice. The problem has been resolved. He lost consciousness for a period of less than one minute. Pertinent negatives include palpitations. He has tried nothing for the symptoms. The treatment provided no relief.    Past Medical History:  Diagnosis Date  . Coronary artery disease   . LVH (left ventricular hypertrophy)   . Mild bibasilar atelectasis    and/or pneumonia  . Mitral regurgitation   . Respiratory failure, acute (HCC)   . STEMI (ST elevation myocardial infarction) (HCC)     There are no active problems to display for this patient.   No past surgical history on file.     Home Medications    Prior to Admission medications   Medication Sig Start Date End Date Taking? Authorizing Provider  albuterol (PROVENTIL HFA;VENTOLIN HFA) 108 (90 Base) MCG/ACT inhaler Inhale 2 puffs into the lungs every 6 (six)  hours as needed for wheezing or shortness of breath.    [provider]  aspirin 81 MG tablet Take 81 mg by mouth daily.    [provider]  atorvastatin (LIPITOR) 40 MG tablet Take 40 mg by mouth daily.    [provider]  calcium carbonate (TUMS - DOSED IN MG ELEMENTAL CALCIUM) 500 MG chewable tablet Chew 1 tablet by mouth 4 (four) times daily.    [provider]  cetirizine (ZYRTEC) 10 MG tablet Take 10 mg by mouth daily.    [provider]  colchicine 0.6 MG tablet Take 0.6 mg by mouth daily.    [provider]  famotidine (PEPCID) 10 MG tablet Take 10 mg by mouth as needed for heartburn or indigestion.    [provider]  fluticasone (FLONASE) 50 MCG/ACT nasal spray Place 1 spray into both nostrils daily.    [provider]  Metoprolol-Hydrochlorothiazide 50-12.5 MG TB24 Take 1 tablet by mouth daily.     [provider]  Multiple Vitamin (MULTIVITAMIN) tablet Take 1 tablet by mouth daily.    [provider]  sacubitril-valsartan (ENTRESTO) 24-26 MG Take 1 tablet by mouth 2 (two) times daily. 08/17/17   Lyn RecordsSmith, Henry W, MD    Family History Family History  Problem Relation Age of Onset  . Healthy Mother   . Healthy Father     Social History  Social History   Tobacco Use  . Smoking status: Never Smoker  . Smokeless tobacco: Never Used  Substance Use Topics  . Alcohol use: No    Frequency: Never  . Drug use: No     Allergies   Augmentin [amoxicillin-pot clavulanate]   Review of Systems Review of Systems  Cardiovascular: Positive for syncope. Negative for palpitations.  All other systems reviewed and are negative.    Physical Exam Updated Vital Signs BP (!) 80/62   Pulse 100   Temp 99.3 F (37.4 C) (Oral)   Resp 17   Ht 6\' 2"  (1.88 m)   Wt 99.3 kg (219 lb)   SpO2 96%   BMI 28.12 kg/m   Physical Exam  Constitutional: He is oriented to person, place, and time. He appears  well-developed and well-nourished. No distress.  HENT:  Head: Normocephalic and atraumatic.  Mouth/Throat: Oropharynx is clear and moist.  Neck: Normal range of motion. Neck supple.  Cardiovascular: Normal rate and regular rhythm. Exam reveals no friction rub.  No murmur heard. Pulmonary/Chest: Effort normal and breath sounds normal. No respiratory distress. He has no wheezes. He has no rales.  Abdominal: Soft. Bowel sounds are normal. He exhibits no distension. There is no tenderness.  Musculoskeletal: Normal range of motion. He exhibits no edema.  Neurological: He is alert and oriented to person, place, and time. No cranial nerve deficit. He exhibits normal muscle tone. Coordination normal.  Skin: Skin is warm and dry. He is not diaphoretic.  Nursing note and vitals reviewed.    ED Treatments / Results  Labs (all labs ordered are listed, but only abnormal results are displayed) Labs Reviewed  I-STAT CG4 LACTIC ACID, ED - Abnormal; Notable for the following components:      Result Value   Lactic Acid, Venous 3.05 (*)    All other components within normal limits  COMPREHENSIVE METABOLIC PANEL  CBC WITH DIFFERENTIAL/PLATELET  BRAIN NATRIURETIC PEPTIDE  I-STAT TROPONIN, ED    EKG  EKG Interpretation  Date/Time:  Friday August 18 2017 05:56:37 EST Ventricular Rate:  105 PR Interval:    QRS Duration: 95 QT Interval:  387 QTC Calculation: 512 R Axis:   180 Text Interpretation:  Sinus tachycardia Left atrial enlargement Probable anterolateral infarct, age indeterm Abnormal T, consider ischemia, inferior leads Prolonged QT interval Confirmed by Geoffery Lyons ([id number]) on 08/18/2017 6:24:33 AM       Radiology No results found.  Procedures Procedures (including critical care time)  Medications Ordered in ED Medications  sodium chloride 0.9 % bolus 1,000 mL (1,000 mLs Intravenous New Bag/Given 08/18/17 0612)     Initial Impression / Assessment and Plan / ED Course  I have  reviewed the triage vital signs and the nursing notes.  Pertinent labs & imaging results that were available during my care of the patient were reviewed by me and considered in my medical decision making (see chart for details).  Patient presents here after experiencing 2 syncopal episodes.  He has recent CABG x5 performed in [facility].  He was seen by cardiology yesterday and everything seemed to be going well.  His workup today reveals no acute abnormality.  Troponin is negative.  I-STAT Chem-8 is unremarkable.  Lactate is 3.  He was given IV fluids and his blood pressure has improved.  I have discussed the care with cardiology who will evaluate and likely admit.  Final Clinical Impressions(s) / ED Diagnoses   Final diagnoses:  None    ED  Discharge Orders    None       Geoffery Lyons, MD 08/18/17 [phone]

## 2017-08-18 NOTE — ED Notes (Signed)
Admitting at the bedside.  

## 2017-08-18 NOTE — Telephone Encounter (Signed)
Left message on wife's phone letting her know that I am sending message to Dr. Katrinka BlazingSmith to let him know pt currently in hospital.  Advised if she had any questions, please feel free to call the office back.

## 2017-08-18 NOTE — ED Notes (Signed)
Cardiology at bedside.

## 2017-08-18 NOTE — ED Notes (Signed)
Pt moved onto hospital bed. Pt given lunch tray. A&Ox4. Respirations even and unlabored.

## 2017-08-18 NOTE — ED Notes (Signed)
Cardiology paged by secretary regarding pt troponin

## 2017-08-18 NOTE — ED Notes (Signed)
Dr. Butler notified of elevated CG-4 

## 2017-08-18 NOTE — H&P (Signed)
Cardiology Admission History and Physical:   Patient ID: Brad Lee; MRN: [id number]; DOB: 1976/04/23   Admission date: 08/18/2017  Primary Care Provider: Henrine Screws, MD Primary Cardiologist: Lesleigh Noe, MD  Primary Electrophysiologist:  None  Chief Complaint:  syncope  Patient Profile:   Brad Lee is a 42 y.o. male with PMH of recent STEMI s/p CABG 07/28/2017 with LIMA to LAD, SVG to diagonal, SVG to OM, and sequential SVG to PDA and PL, who presents with syncope x2. Admitted to cardiology for further management.   History of Present Illness:   Brad Lee is a 42 y.o. male with PMH of recent STEMI 07/25/2017, s/p CABG 07/28/2017 with LIMA to LAD, SVG to diagonal, SVG to OM, and sequential SVG to PDA and PL, who presents with syncope x2. Patient states he had been recovering well until the evening of 08/17/17 when he felt weak and had to go to the bathroom to have a bowel movement. He reports having diarrhea, then experienced a syncopal episode. EMS was activated and patient was reported to be hypotensive. Patient experienced diaphoresis and nausea following syncopal episode. EMS assisted patient downstairs, at which point he synopsized again. Wife reports that he was unconscious for several seconds with each syncopal event, but was without seizure activity, tongue biting, or loss of bowel/bladder. En route to the hospital IV fluids were administered for hypotension. Patient denies any chest pain or SOB surrounding syncopal events. He reports feeling better after receiving IVF. He is reports fatigue this AM but otherwise feels back to his usual self. He denies recent fever, CP, SOB, orthopnea, LE edema, nasal congestion, cough, abdominal pain, nausea, vomiting, dysuria.    Patient was seen by Dr. Katrinka Blazing earlier on 08/17/2017 to establish cardiology care. 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 He was found to have a STEMI and taken directly to the catheterization laboratory where the LAD was found to be totally occluded and there was high-grade disease noted in the circumflex and RCA. Attempts to opend the LAD were unscuccessful and subsequently felt to be chroically totally occluded. High-grade obstruction in the very distal RCA before the PDA was noted.  The RCA was felt to supply collaterals to the anterior wall.  Angioplasty using a 1.2 mm balloon was performed on the tight distal RCA stenosis with improvement in symptoms.  A stent was not placed.  Later that day the patient had recurrence of symptoms, nausea, and dyspnea.  He was taken back to the Cath Lab where anatomy was unchanged and subsequently a balloon pump was placed.  At this point it was noted that his cardiac markers revealed a troponin greater than 65.  He was seen by cardiac surgery who felt he should be cooled off for 72 hours before coronary bypass grafting. CABG was performed on 07/28/2017  A cutdown was performed on the left femoral artery for removal of the previously placed balloon pump and reinsertion to allow weaning from bypass.  Recent LVEF by echo performed on 07/29/17 was 45%.  A transesophageal echo performed on 08/06/2017 to rule out vegetations revealed an EF of 30-35%. Patient was discharged on furosemide 20mg  daily, metoprolol LX 50mg  daily, colchicine 0.6mg  daily, ASA 81mg  daily, and lisinopril 2.5mg  daily, along with zyrtec 10mg  daily and famotidine 10mg  daily. At the time of his appointment 08/17/2017 he was without CP,  SOB, palpitations, chest/sternal clicking, peripheral edema. He sleeps on 1 pillow. Decision made to discontinue lisinopril and start Entresto in the evening on 08/18/2017. Plans to repeat echocardiogram after 90 days of medical management of heart failure with consideration for a defibrillator if LVEF remains <35%.   ED course: Hypotensive  on arrival (73/55), now improved to 117/77, tachycardic, mildly tachepneic, satting well on RA, afebrile. Labs with Na and K wnl, Cr. 1.14, mild leukocytosis 13.5, Hgb 15.3, Troponin 0.05, BNP 201. EKG sinus tachycardia, LAE, diffuse T wave abnormalities, large Q waves in anterolateral leads, no STE/D, QTC 512. CXR negative. CT Chest without PE although moderate quality exam; small L pleural effusion w mild loculation - atelectasis vs mild infection. Patient was given IV fluids and admitted to cardiology for further management/ work-up.      Past Medical History:  Diagnosis Date  . Coronary artery disease   . LVH (left ventricular hypertrophy)   . Mild bibasilar atelectasis    and/or pneumonia  . Mitral regurgitation   . Respiratory failure, acute (HCC)   . 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 STEMI (ST elevation myocardial infarction) [facility](HCC)     Past Surgical History:  Procedure Laterality Date  . CORONARY ARTERY BYPASS GRAFT  07/28/2017  . TONSILLECTOMY       Medications Prior to Admission: Prior to Admission medications   Medication Sig Start Date End Date Taking? Authorizing Provider  albuterol (PROVENTIL HFA;VENTOLIN HFA) 108 (90 Base) MCG/ACT inhaler Inhale 2 puffs into the lungs every 6 (six) hours as needed for wheezing or shortness of breath.   Yes [provider]  aspirin 81 MG chewable tablet Chew 81 mg by mouth daily.   Yes [provider]  atorvastatin (LIPITOR) 40 MG tablet Take 40 mg by mouth daily.   Yes [provider]  calcium carbonate (TUMS - DOSED IN MG ELEMENTAL CALCIUM) 500 MG chewable tablet Chew 1 tablet by mouth daily as needed for indigestion or heartburn.    Yes [provider]  cetirizine (ZYRTEC) 10 MG tablet Take 10 mg by mouth daily as needed for allergies.    Yes [provider]  colchicine 0.6 MG tablet Take 0.6 mg by mouth daily.   Yes [provider]  famotidine (PEPCID) 10 MG tablet Take 10 mg by mouth  daily as needed for heartburn or indigestion.    Yes [provider]  fluticasone (FLONASE) 50 MCG/ACT nasal spray Place 1 spray into both nostrils daily as needed for allergies.    Yes [provider]  furosemide (LASIX) 20 MG tablet Take 20 mg by mouth daily.   Yes [provider]  metoprolol succinate (TOPROL-XL) 50 MG 24 hr tablet Take 50 mg by mouth daily. Take with or immediately following a meal.   Yes [provider]  Multiple Vitamin (MULTIVITAMIN) tablet Take 1 tablet by mouth daily.   Yes [provider]  sacubitril-valsartan (ENTRESTO) 24-26 MG Take 1 tablet by mouth 2 (two) times daily. Patient not taking: Reported on 08/18/2017 08/17/17   Lyn RecordsSmith, Henry W, MD     Allergies:    Allergies  Allergen Reactions  . Augmentin [Amoxicillin-Pot Clavulanate] Other (See Comments)    Increased heart rate    Social History:   Social History   Socioeconomic History  . Marital status: Married    Spouse name: Not on file  . Number of children: 0  . Years of education: Not on file  . Highest education level:  Not on file  Social Needs  . Financial resource strain: Not on file  . Food insecurity - worry: Not on file  . Food insecurity - inability: Not on file  . Transportation needs - medical: Not on file  . Transportation needs - non-medical: Not on file  Occupational History  . Occupation: OPERATIONS MANAGER  Tobacco Use  . Smoking status: Never Smoker  . Smokeless tobacco: Never Used  Substance and Sexual Activity  . Alcohol use: No    Frequency: Never  . Drug use: No  . Sexual activity: Not Currently  Other Topics Concern  . Not on file  Social History Narrative  . Not on file    Family History:   The patient's family history includes Healthy in his father and mother.    ROS:  Please see the history of present illness.  All other ROS reviewed and negative.     Physical Exam/Data:   Vitals:   08/18/17 0800 08/18/17 0916  08/18/17 0930 08/18/17 0945  BP: 117/77 106/84 (!) 117/48 102/81  Pulse: (!) 109 (!) 115 (!) 113 (!) 113  Resp: (!) 22 (!) 28 (!) 24 19  Temp:      TempSrc:      SpO2: 96% 99% 97% 99%  Weight:      Height:        Intake/Output Summary (Last 24 hours) at 08/18/2017 1020 Last data filed at 08/18/2017 0919 Gross per 24 hour  Intake -  Output 500 ml  Net -500 ml   Filed Weights   08/18/17 0601  Weight: 219 lb (99.3 kg)   Body mass index is 28.12 kg/m.  General:  Well nourished, well developed, laying in bed in no acute distress HEENT: sclera anicteric  Neck: no JVD Vascular: No carotid bruits; FA pulses 2+ bilaterally without bruits  Cardiac:  normal S1, S2; tachycardic, regular rhytyhm; no murmur; midline scar healing well with small area open at the top without drainage/pus; lower chest bandage C/D/I Lungs:  clear to auscultation bilaterally, no wheezing, rhonchi or rales  Abd: soft, nontender, no hepatomegaly  Ext: no edema Musculoskeletal:  No deformities, BUE and BLE strength normal and equal Skin: warm and dry  Neuro:  CNs 2-12 intact, no focal abnormalities noted Psych:  Normal affect   EKG:  The ECG that was done 08/18/17 was personally reviewed and demonstrates sinus tachycardia, LAE, diffuse T wave abnormalities, large Q waves in anterolateral leads, no STE/D, QTC 512. Telemetry: sinus tachycardia  Relevant CV Studies: Data from [facility]:  Carotid duplex and Doppler study preop for surgery: No significant obstruction/less than 50% bilateral extracranial carotid arteries.  Echocardiogram 07/25/2017 EF 50% with anterolateral and apical hypokinesis.    Echocardiogram 07/27/2017 with EF 55-60% hypokinesis of the basal apical walls.    Echocardiogram 07/29/17 EF 40-45% with apical and apical hypokinesis.  Cardiac cath 07/25/2017: Severe multivessel coronary disease with 70% ostial and chronic total occlusion of the mid LAD.  No visible  diagonals were noted.  Circumflex is felt to be small after the first obtuse marginal origin.  First obtuse marginal 90% ostial proximal narrowing and there is 90% obstruction in the inferior limb after bifurcating RCA is diffusely diseased 50% dated 70% RV marginal, and distal 99% RCA proximal to the PDA and LV branch.  Normal left ventricular ejection fraction of 50-60% with LVEDP 24 mmHg.  1.2 mm balloon angioplasty of the distal right coronary was performed.  Coronary bypass grafting  on 07/28/2017: LIMA to LAD, SVG to diagonal, SVG to OM1, sequential SVG to PDA and posterolateral.  Also mention of left groin exploration by cutdown for removal of clotted/static intra-aortic balloon pump and sheath with placement of new sheath and balloon pump through open groin.  Transesophageal echocardiography performed on 08/10/2017 to rule out valvular vegetation demonstrated EF 30-35% with diffuse hypokinesis, no other significant findings were mentioned.   Laboratory Data:  Chemistry Recent Labs  Lab 08/18/17 0610 08/18/17 0655  NA 135 137  K 4.2 4.0  CL 102 105  CO2 19*  --   GLUCOSE 112* 116*  BUN 11 12  CREATININE 1.17 1.10  CALCIUM 9.3  --   GFRNONAA >60  --   GFRAA >60  --   ANIONGAP 14  --     Recent Labs  Lab 08/18/17 0610  PROT 8.1  ALBUMIN 3.6  AST 31  ALT 30  ALKPHOS 148*  BILITOT 0.7   Hematology Recent Labs  Lab 08/18/17 0610 08/18/17 0655  WBC 11.4*  --   RBC 5.86*  --   HGB 14.9 16.0  HCT 46.5 47.0  MCV 79.4  --   MCH 25.4*  --   MCHC 32.0  --   RDW 15.8*  --   PLT PLATELET CLUMPS NOTED ON SMEAR, COUNT APPEARS ADEQUATE  --    Cardiac EnzymesNo results for input(s): TROPONINI in the last 168 hours.  Recent Labs  Lab 08/18/17 0600  TROPIPOC 0.05    BNP Recent Labs  Lab 08/18/17 0610  BNP 201.0*    DDimer No results for input(s): DDIMER in the last 168 hours.  Radiology/Studies:  Ct Angio Chest Pe W And/or Wo Contrast  Result Date:  08/18/2017 CLINICAL DATA:  Syncopal episode while using the bathroom. CABG approximately 3 weeks ago. EXAM: CT ANGIOGRAPHY CHEST WITH CONTRAST TECHNIQUE: Multidetector CT imaging of the chest was performed using the standard protocol during bolus administration of intravenous contrast. Multiplanar CT image reconstructions and MIPs were obtained to evaluate the vascular anatomy. CONTRAST:  <See Chart> ISOVUE-370 IOPAMIDOL (ISOVUE-370) INJECTION 76% COMPARISON:  Plain films of earlier today. FINDINGS: Cardiovascular: The quality of this exam for evaluation of pulmonary embolism is moderate. Limitations include mild motion and suboptimal bolus timing, with the bolus centered in the SVC. No pulmonary embolism to the lobar or large segmental level. Smaller emboli cannot be excluded. Normal aortic caliber. Mild cardiomegaly. Small pericardial effusion is likely physiologic. Status post median sternotomy. Retrosternal small volume relatively ill-defined edema and fluid, including on image 56/series 5. No well-defined fluid collection. Mediastinum/Nodes: No mediastinal or hilar adenopathy. Tiny hiatal hernia. Lungs/Pleura: Small left pleural effusion with minimal loculation including on image 106/series 5. No pneumothorax. Left worse than right base airspace disease. Upper Abdomen: Normal imaged portions of the liver, spleen, right adrenal gland. Musculoskeletal: Mild bilateral gynecomastia. Review of the MIP images confirms the above findings. IMPRESSION: 1. Moderate quality exam for evaluation of pulmonary embolism. No pulmonary embolism with limitations above. 2. Small left pleural effusion with mild loculation. Bibasilar airspace disease. On the right, favored to represent atelectasis. On the left, this could represent atelectasis or mild infection. 3. Median sternotomy with ill-defined retrosternal fluid and edema, favored to be within normal variation in the postoperative state. No well-defined fluid collection. 4.  Mild gynecomastia. 5.  Tiny hiatal hernia. Electronically Signed   By: Jeronimo Greaves M.D.   On: 08/18/2017 08:41   Dg Chest Port 1 View  Result Date: 08/18/2017 CLINICAL  DATA:  Syncope and weakness. Recent coronary bypass surgery. EXAM: PORTABLE CHEST 1 VIEW COMPARISON:  None. FINDINGS: Postoperative changes in the mediastinum. Shallow inspiration. Cardiac enlargement. No vascular congestion or edema. No blunting of costophrenic angles. No pneumothorax. No focal consolidation or airspace disease. IMPRESSION: Cardiac enlargement.  No evidence of active pulmonary disease. Electronically Signed   By: Burman Nieves M.D.   On: 08/18/2017 06:41    Assessment and Plan:   1. Syncope: appears to be vasovagal given episode occurred after bowel movement.  - s/p 1L NS - CT Chest without PE although moderate quality study - EKG with sinus tachycardia, LAE, diffuse T wave abnormalities, large Q waves in anterolateral leads, no STE/D, QTC 512. - Troponin 0.05 - will trend x3 - Will continue to monitor on telemetry  2. CAD s/p CABG: patient has been recovering well since STEMI 07/25/2017 in [place], [place]. Found to have multivessel disease and underwent CABG 07/28/2017 with LIMA to LAD, SVG to diagonal, SVG to OM, and sequential SVG to PDA and PL. Patient was seen by Dr. Katrinka Blazing 08/17/2017 to establish cardiology care.  - Continue ASA and statin  3. Ischemic cardiomyopathy: TEE 08/10/17 with EF 30-35%. Volume status appears stable - Continue metoprolol XL  - Will hold lasix and entresto for now given hypotension - will consider restarting tomorrow if BP stable - Plan to repeat echocardiogram after 3 months of medical therapy to evaluate for improvement in LVEF. If no change, may need to consider defibrillator.  - Will ask EP to assist with lifevest - Monitor volume status closely with IVF administration  4. Prolonged QT: QTC 516 on EKG. K 4.0 - Will add on Magnesium and replete if low - Avoid QT prolonging  medications - Will obtain EKG in AM for close monitoring  5. Mild Leukocytosis:One episode of diarrhea overnight, also with some post-nasal drainage this AM. Otherwise without fever, chills/sweats, cough, congestion, abdominal pain, dysuria.  - CXR negative - CT Chest with L sided atelectasis vs mild infection - Will monitor CBC for now - WBC 13.5>11.4 - No clear role for antibiotics at this time.   6. Elevated lactate: - 3.05>2.31 after IVF - Will given additional 1L NS today and recheck lactate    Severity of Illness: The appropriate patient status for this patient is OBSERVATION. Observation status is judged to be reasonable and necessary in order to provide the required intensity of service to ensure the patient's safety. The patient's presenting symptoms, physical exam findings, and initial radiographic and laboratory data in the context of their medical condition is felt to place them at decreased risk for further clinical deterioration. Furthermore, it is anticipated that the patient will be medically stable for discharge from the hospital within 2 midnights of admission. The following factors support the patient status of observation.   " The patient's presenting symptoms include syncope. " The physical exam findings include tachycardia and hypotension. " The initial radiographic and laboratory data are EKG without STE/D; CT chest with atelectasis vs mild infection; labs with elevated lactate. .     For questions or updates, please contact [facility] Please consult [email or web address] for contact info under Cardiology/STEMI.    Signed, Beatriz Stallion, PA-C  08/18/2017 10:20 AM  As above, patient seen and examined.  Briefly he is a 42 year old male with past medical history of recent myocardial infarction followed by coronary artery bypass and graft in [facility], ischemic cardiomyopathy with syncope.  Per notes his ejection fraction following  his recent surgery was 30-35%.  He  was discharged with a life vest.  However it has not functioned appropriately by his report.  He denies dyspnea, chest pain or palpitations.  This morning after having a bowel movement he developed diaphoresis, nausea followed by brief syncope.  No preceding chest pain, palpitations or dyspnea.  EMS was called and as they were walking him down stairs he felt the need to have another bowel movement and had similar symptoms followed by a second syncopal episode.  Cardiology asked to evaluate.  CTA shows no pulmonary embolus.  Electrocardiogram shows sinus tachycardia, anterior infarct, diffuse T wave inversion and prolonged QT interval. 1 syncope-symptoms sound to have been defecation mediated syncope.  He had diaphoresis and nausea following a bowel movement and had similar preceding symptoms prior to the second episode.  We will admit and follow on telemetry.  I am concerned however given patient's ischemic cardiomyopathy and ejection fraction of 30-35%.  I would ordinarily recommend a LifeVest with reassessment of LV function in approximately 12 weeks.  He would then require ICD if ejection fraction remains less than 35%.  However patient states his LifeVest is not working appropriately.  On telemetry in the emergency room there appears to be oversensing of his T waves resulting in false documentation of tachycardia.  I will ask electrophysiology to see for recommendations. 2 coronary artery disease-continue aspirin and statin.   3 ischemic cardiomyopathy-continue beta-blocker.  Given low blood pressure we will hold Lasix and Entresto which was to be initiated today.  We can resume tomorrow pending blood pressure results. 4 prolonged QT interval  Olga Millers, MD

## 2017-08-18 NOTE — Progress Notes (Signed)
   Spoke w/ Dr Katrinka BlazingSmith  Pt BPs were 30 pts different R>L.  He requests BPs be checked in the R arm.  Order written.  Theodore Demarkhonda Barrett, PA-C 08/18/2017 1:31 PM Beeper [phone]

## 2017-08-18 NOTE — ED Notes (Signed)
Ordered heart healthy tray  

## 2017-08-18 NOTE — ED Notes (Signed)
Pt given water and ice chips per Dr. Charm BargesButler

## 2017-08-18 NOTE — Progress Notes (Signed)
Brad MonarchJarrett Lee is a 42 y.o. male patient admitted from ED awake, alert - oriented  X 4 - no acute distress noted.  VSS - Blood pressure 108/72, pulse (!) 105, temperature 99.4 F (37.4 C), temperature source Oral, resp. rate (!) 26, height 6\' 2"  (1.88 m), weight 99.3 kg (219 lb), SpO2 96 %.    IV in place, occlusive dsg intact without redness.  Orientation to room, and floor completed with information packet given to patient/family.  Patient declined safety video at this time.  Admission INP armband ID verified with patient/family, and in place.   SR up x 2, fall assessment complete, with patient and family able to verbalize understanding of risk associated with falls, and verbalized understanding to call nsg before up out of bed.  Call light within reach, patient able to voice, and demonstrate understanding.  Skin, clean-dry- intact without evidence of bruising, or skin tears.   No evidence of skin break down noted on exam.     Will cont to eval and treat per MD orders.  [name], RN 08/18/2017 5:37 PM

## 2017-08-18 NOTE — ED Triage Notes (Signed)
Patient c/o feeling nauseas, and then passing out. Has also had diarrhea. States that this occurred x2. Had open heart surgery x2

## 2017-08-18 NOTE — Telephone Encounter (Signed)
New Message  Patient wife Brad DavenportSandra called in to inform that her spouse has been [name] this morning. She advised that they did an EKG as well as a  CT scan to  rule out blood clots. They are trying to figure out why his BP is so low. She is wanting to inform Dr.Smith. Please call to discuss.

## 2017-08-19 ENCOUNTER — Encounter (HOSPITAL_COMMUNITY): Payer: Self-pay | Admitting: Nurse Practitioner

## 2017-08-19 DIAGNOSIS — I25708 Atherosclerosis of coronary artery bypass graft(s), unspecified, with other forms of angina pectoris: Secondary | ICD-10-CM

## 2017-08-19 DIAGNOSIS — I519 Heart disease, unspecified: Secondary | ICD-10-CM

## 2017-08-19 DIAGNOSIS — I5022 Chronic systolic (congestive) heart failure: Secondary | ICD-10-CM | POA: Diagnosis not present

## 2017-08-19 DIAGNOSIS — R55 Syncope and collapse: Secondary | ICD-10-CM | POA: Diagnosis not present

## 2017-08-19 LAB — CBC
HCT: 37.8 % — ABNORMAL LOW (ref 39.0–52.0)
Hemoglobin: 11.9 g/dL — ABNORMAL LOW (ref 13.0–17.0)
MCH: 24.7 pg — ABNORMAL LOW (ref 26.0–34.0)
MCHC: 31.5 g/dL (ref 30.0–36.0)
MCV: 78.6 fL (ref 78.0–100.0)
Platelets: 297 10*3/uL (ref 150–400)
RBC: 4.81 MIL/uL (ref 4.22–5.81)
RDW: 15.5 % (ref 11.5–15.5)
WBC: 5.5 10*3/uL (ref 4.0–10.5)

## 2017-08-19 LAB — LACTIC ACID, PLASMA: LACTIC ACID, VENOUS: 1.5 mmol/L (ref 0.5–1.9)

## 2017-08-19 LAB — BASIC METABOLIC PANEL
Anion gap: 9 (ref 5–15)
BUN: 8 mg/dL (ref 6–20)
CALCIUM: 8.8 mg/dL — AB (ref 8.9–10.3)
CO2: 22 mmol/L (ref 22–32)
CREATININE: 1.13 mg/dL (ref 0.61–1.24)
Chloride: 105 mmol/L (ref 101–111)
GFR calc Af Amer: >60 mL/min (ref >60)
GLUCOSE: 103 mg/dL — AB (ref 65–99)
Potassium: 3.8 mmol/L (ref 3.5–5.1)
Sodium: 136 mmol/L (ref 135–145)

## 2017-08-19 LAB — GLUCOSE, CAPILLARY
Glucose-Capillary: 99 mg/dL (ref 65–99)
Glucose-Capillary: 111 mg/dL — ABNORMAL HIGH (ref 65–99)

## 2017-08-19 LAB — MAGNESIUM: MAGNESIUM: 1.9 mg/dL (ref 1.7–2.4)

## 2017-08-19 LAB — TROPONIN I: TROPONIN I: 0.06 ng/mL — AB (ref <0.03)

## 2017-08-19 LAB — HIV ANTIBODY (ROUTINE TESTING W REFLEX): HIV Screen 4th Generation wRfx: NONREACTIVE

## 2017-08-19 MED ORDER — ADULT MULTIVITAMIN LIQUID CH
15.0000 mL | Freq: Every day | ORAL | Status: DC
  Filled 2017-08-19: qty 15

## 2017-08-19 NOTE — Progress Notes (Signed)
Nutrition Brief Note  Patient identified on the Malnutrition Screening Tool (MST) Report  Wt Readings from Last 15 Encounters:  08/19/17 213 lb 14.4 oz (97 kg)  08/17/17 219 lb 3.2 oz (99.4 kg)    Body mass index is 27.46 kg/m. Patient meets criteria for overweight based on current BMI. Pt reports weight loss is intentional.   Current diet order is heart, patient is consuming approximately 100% of meals at this time. Pt reports having a good appetite currently with no other difficulties. Questions regarding heart healthy/low sodium diet were answered. Labs and medications reviewed.   No nutrition interventions warranted at this time. If nutrition issues arise, please consult RD.   Roslyn SmilingStephanie Imaad Reuss, MS, RD, LDN Pager # [phone] After hours/ weekend pager # [phone]

## 2017-08-19 NOTE — Progress Notes (Signed)
Progress Note  Patient Name: Brad Lee Date of Encounter: 08/19/2017  Primary Cardiologist: Lesleigh Noe, MD  Primary Electrophysiologist:  (New) Dr Elberta Fortis  Subjective   Doing well. Denies chest pain, shortness of breath, and dizziness. We spoke at length about the events which occurred in [facility] leading to CABG.  Inpatient Medications    Scheduled Meds: . aspirin  81 mg Oral Daily  . atorvastatin  40 mg Oral q1800  . colchicine  0.6 mg Oral Daily  . enoxaparin (LOVENOX) injection  40 mg Subcutaneous Q24H  . loratadine  10 mg Oral Daily  . metoprolol succinate  50 mg Oral Daily  . multivitamin  15 mL Oral Daily  . sodium chloride flush  3 mL Intravenous Q12H   Continuous Infusions:  PRN Meds: calcium carbonate, famotidine, fluticasone   Vital Signs    Vitals:   08/19/17 0505 08/19/17 0746 08/19/17 0800 08/19/17 0839  BP: 105/65 129/81  115/82  Pulse: 93 95    Resp: 18 15 (!) 21 19  Temp: 99.3 F (37.4 C) 98.4 F (36.9 C)    TempSrc: Oral Oral    SpO2: 97% 97%    Weight:      Height:        Intake/Output Summary (Last 24 hours) at 08/19/2017 1142 Last data filed at 08/19/2017 0505 Gross per 24 hour  Intake 1482 ml  Output 775 ml  Net 707 ml   Filed Weights   08/18/17 0601 08/19/17 0500  Weight: 219 lb (99.3 kg) 213 lb 14.4 oz (97 kg)    Telemetry    Sinus rhythm - Personally Reviewed  ECG    Sinus rhythm with anterolateral infarct with diffuse T wave inversions, QTc 484 ms  - Personally Reviewed  Physical Exam   GEN: No acute distress.   Neck: No JVD Cardiac: RRR, no murmurs, rubs, or gallops.  Respiratory: Clear to auscultation bilaterally. GI: Soft, nontender, non-distended  MS: No edema; No deformity. Neuro:  Nonfocal  Psych: Normal affect   Labs    Chemistry Recent Labs  Lab 08/18/17 0610 08/18/17 0655 08/18/17 1154 08/19/17 0324  NA 135 137  --  136  K 4.2 4.0  --  3.8  CL 102 105  --  105  CO2 19*  --   --  22    GLUCOSE 112* 116*  --  103*  BUN 11 12  --  8  CREATININE 1.17 1.10 1.07 1.13  CALCIUM 9.3  --   --  8.8*  PROT 8.1  --   --   --   ALBUMIN 3.6  --   --   --   AST 31  --   --   --   ALT 30  --   --   --   ALKPHOS 148*  --   --   --   BILITOT 0.7  --   --   --   GFRNONAA >60  --  >60 >60  GFRAA >60  --  >60 >60  ANIONGAP 14  --   --  9     Hematology Recent Labs  Lab 08/18/17 0610 08/18/17 0655 08/18/17 1154 08/19/17 0324  WBC 11.4*  --  8.8 5.5  RBC 5.86*  --  5.28 4.81  HGB 14.9 16.0 13.4 11.9*  HCT 46.5 47.0 41.8 37.8*  MCV 79.4  --  79.2 78.6  MCH 25.4*  --  25.4* 24.7*  MCHC 32.0  --  32.1 31.5  RDW 15.8*  --  15.6* 15.5  PLT PLATELET CLUMPS NOTED ON SMEAR, COUNT APPEARS ADEQUATE  --  335 297    Cardiac Enzymes Recent Labs  Lab 08/18/17 1154 08/18/17 1747 08/18/17 2331  TROPONINI 0.06* 0.06* 0.06*    Recent Labs  Lab 08/18/17 0600  TROPIPOC 0.05     BNP Recent Labs  Lab 08/18/17 0610  BNP 201.0*     DDimer No results for input(s): DDIMER in the last 168 hours.   Radiology    Ct Angio Chest Pe W And/or Wo Contrast  Result Date: 08/18/2017 CLINICAL DATA:  Syncopal episode while using the bathroom. CABG approximately 3 weeks ago. EXAM: CT ANGIOGRAPHY CHEST WITH CONTRAST TECHNIQUE: Multidetector CT imaging of the chest was performed using the standard protocol during bolus administration of intravenous contrast. Multiplanar CT image reconstructions and MIPs were obtained to evaluate the vascular anatomy. CONTRAST:  <See Chart> ISOVUE-370 IOPAMIDOL (ISOVUE-370) INJECTION 76% COMPARISON:  Plain films of earlier today. FINDINGS: Cardiovascular: The quality of this exam for evaluation of pulmonary embolism is moderate. Limitations include mild motion and suboptimal bolus timing, with the bolus centered in the SVC. No pulmonary embolism to the lobar or large segmental level. Smaller emboli cannot be excluded. Normal aortic caliber. Mild cardiomegaly. Small  pericardial effusion is likely physiologic. Status post median sternotomy. Retrosternal small volume relatively ill-defined edema and fluid, including on image 56/series 5. No well-defined fluid collection. Mediastinum/Nodes: No mediastinal or hilar adenopathy. Tiny hiatal hernia. Lungs/Pleura: Small left pleural effusion with minimal loculation including on image 106/series 5. No pneumothorax. Left worse than right base airspace disease. Upper Abdomen: Normal imaged portions of the liver, spleen, right adrenal gland. Musculoskeletal: Mild bilateral gynecomastia. Review of the MIP images confirms the above findings. IMPRESSION: 1. Moderate quality exam for evaluation of pulmonary embolism. No pulmonary embolism with limitations above. 2. Small left pleural effusion with mild loculation. Bibasilar airspace disease. On the right, favored to represent atelectasis. On the left, this could represent atelectasis or mild infection. 3. Median sternotomy with ill-defined retrosternal fluid and edema, favored to be within normal variation in the postoperative state. No well-defined fluid collection. 4. Mild gynecomastia. 5.  Tiny hiatal hernia. Electronically Signed   By: Jeronimo GreavesKyle  Talbot M.D.   On: 08/18/2017 08:41   Dg Chest Port 1 View  Result Date: 08/18/2017 CLINICAL DATA:  Syncope and weakness. Recent coronary bypass surgery. EXAM: PORTABLE CHEST 1 VIEW COMPARISON:  None. FINDINGS: Postoperative changes in the mediastinum. Shallow inspiration. Cardiac enlargement. No vascular congestion or edema. No blunting of costophrenic angles. No pneumothorax. No focal consolidation or airspace disease. IMPRESSION: Cardiac enlargement.  No evidence of active pulmonary disease. Electronically Signed   By: Burman NievesWilliam  Stevens M.D.   On: 08/18/2017 06:41    Cardiac Studies   N/a  Patient Profile     42 y.o. male with a PMH of a recent STEMI 07/25/2017 s/p CABG x5 on 07/28/2017 [place] and ischemic  cardiomyopathy with EF of 30-35% by TEE on 08/11/2107  who was hospitalized with syncope.  Assessment & Plan    1. Syncope - Likely vasovagal given episode occurred after having a bowel movement. No recurrence. - Patient was hypotensive (73/55) and tachycardic (102) upon arrival to ED. - Patient given 1L NS with improvement. SBP's now in 101-115 range.  -I am holding Lasix and Entresto at the time of discharge and have informed Dr. Katrinka BlazingSmith, his cardiologist. Hopefully, these can  be resumed on 2/8 when he has an appt with him.   2. CAD s/p CABG -Symptomatically stable. - Patient had a STEMI on 07/25/2017 while on a work trip in [place]. S/p CABG x5 (LIMA to LAD, SVG to diagonal, SVG to OM, and sequential SVG to PDA and PL) at [facility].  - Continue aspirin, Toprol-XL, and atorvastatin.   3. Ischemic Cardiomyopathy/Chronic systolic heart failure -Symptomatically stable - TEE 08/10/2017 showed EF of 30-35%.  - Patient does not appear volume overloaded on physical exam.  -I am holding Lasix and Entresto at the time of discharge and have informed Dr. Katrinka Blazing, his cardiologist. Hopefully, these can be resumed on 2/8 when he has an appt with him. - Repeat Echo after 3 months of medical therapy to evaluate for LVEF improvement.  - Patient was wearing a Life Vest upon discharge from [facility] but took it off because it was not functioning properly (double counting due to T wave oversensing).  - Company contacted and Dr. Elberta Fortis spoke with them and adjustments were made.   Disposition: Patient will be discharged today.     For questions or updates, please contact [facility] Please consult [email or web address] for contact info under Cardiology/STEMI.      Signed, Prentice Docker, MD  08/19/2017, 11:42 AM

## 2017-08-19 NOTE — Discharge Instructions (Signed)
**  PLEASE REMEMBER TO BRING ALL OF YOUR MEDICATIONS TO EACH OF YOUR FOLLOW-UP OFFICE VISITS.     You have received care from [facility] during this hospital stay and we look forward to continuing to provide you with excellent care in our office settings after you've left the hospital.  In order to assure a smoother transition to home following your discharge from the hospital, we will likely have you see one of our nurse practitioners or physician assistants within a few weeks of discharge.  Our advanced practice providers work closely with your physician in order to address all of your heart's needs in a timely manner.  More information about all of our providers may be found here: [email or web address]  Please plan to bring all of your prescriptions to your follow-up appointment and don't hesitate to contact us with questions or concerns.  [facility] [place] - [phone] [facility] [place] - [phone] [facility] [address] - [phone] [facility] [place] - [phone] [facility] [place] - [phone] [facility] [place] - [phone] [facility] [place] - [phone] [facility] [place] - [phone] [facility] [place] [phone]   _____________      10 Habits of Highly Healthy People  Emma wants to help you get well and stay well.  Live a longer, healthier life by practicing healthy habits every day.  1.  Visit your primary care provider regularly. 2.  Make time for family and friends.  Healthy relationships are important. 3.  Take medications as directed by your provider. 4.  Maintain a healthy weight and a trim waistline. 5.  Eat healthy meals and snacks, rich in fruits, vegetables, whole grains, and lean proteins. 6.  Get moving every day - aim for 150 minutes of moderate physical activity each week. 7.  Don't smoke. 8.  Avoid  alcohol or drink in moderation. 9.  Manage stress through meditation or mindful relaxation. 10.  Get seven to nine hours of quality sleep each night.  Want more information on healthy habits?  To learn more about these and other healthy habits, visit [email or web address]. _____________

## 2017-08-19 NOTE — Progress Notes (Signed)
[employer] rep into room. Vest applied per rep and pt is ready for discharge.

## 2017-08-19 NOTE — Plan of Care (Signed)
VSS. NSR on cardiac monitor. Tolerates PO diet. Instructed to call prn OOB etc.

## 2017-08-19 NOTE — Progress Notes (Signed)
Interventional Cardiology   Please consider starting Entresto today if appropriate. I was planning on 24/26 mg BID.

## 2017-08-19 NOTE — Discharge Summary (Signed)
Discharge Summary    Patient ID: Brad Lee,  MRN: [id number], DOB/AGE: 1975-07-28 42 y.o.  Admit date: 08/18/2017 Discharge date: 08/19/2017  Primary Care Provider: Henrine Screws Primary Cardiologist: Lesleigh Noe, MD  Discharge Diagnoses    Principal Problem:   Syncope Active Problems:   Chronic systolic heart failure [facility])   Ischemic cardiomyopathy   Coronary artery disease involving native coronary artery of native heart with angina pectoris [facility])  *s/p recent CABG in [facility] [place]   Hyperlipidemia with target LDL less than 70  Allergies Allergies  Allergen Reactions  . Augmentin [Amoxicillin-Pot Clavulanate] Other (See Comments)    Increased heart rate    Diagnostic Studies/Procedures    None _____________   History of Present Illness     42 y/o ? with a h/o CAD s/p recent CABG x 5 complicated by ischemic cardiomyopathy with an EF of 30-35%.  He was discharged home with a LifeVest in place.  Following discharge, he received multiple pre-shock warnings from his vest and stopped wearing it, assuming that it wasn't working appropriately.  He established care with Dr. Katrinka Blazing on 08/17/2017.  He was doing well at office follow-up and he was advised to discontinue lisinopril with a plan to initiate entresto 24-26mg  beginning on the evening of 08/18/2017.  Unfortunately, on the evening of 1/31, he began to experience weakness and developed diarrhea.  While using the toilet, he suffered a syncopal spell.  This was followed by diaphoresis and nausea.  EMS was called and he found to be hypotensive with pressures in the 70's.  He was stabilized and taken downstairs and his home and had a subsequent syncopal event.  In the ED, his BP was 73/55.  He was aggressively hydrated with improvement in BP and resolution of symptoms.  He was admitted for further evaluation.  Hospital Course     Consultants: Electrophysiology   Following admission, Brad Lee remained stable without  recurrence of syncope.  He has been euvolemic.  Troponin is mildly elevated with a flat trend @ 0.06 x 3.  This is not felt to be secondary to ACS.  No events were noted on telemetry and it was felt that the most likely explanation for his symptoms was a vasovagal reaction in the setting of defecation.  EP was consulted in the setting of multiple LifeVest alerts in the absence of symptoms.  It is felt that his LifeVest is over-sensing his T waves, giving the appearance of VT (resting HR's 90's to low 100's at times).  Dr. Elberta Fortis recommended changing his VT detection to 200 bpm.  We have been in touch with the LifeVest representative and this correction will be made prior to discharge today.  Brad Lee and has follow-up with Dr. Katrinka Blazing on 2/8. In the setting of hypotension and syncope, we are holding his lasix and have opted to hold off on initiation of entresto at this time. _____________  Discharge Vitals Blood pressure 109/78, pulse 94, temperature 98.4 F (36.9 C), temperature source Oral, resp. rate 20, height 6\' 2"  (1.88 m), weight 213 lb 14.4 oz (97 kg), SpO2 99 %.  Filed Weights   08/18/17 0601 08/19/17 0500  Weight: 219 lb (99.3 kg) 213 lb 14.4 oz (97 kg)    Labs & Radiologic Studies    CBC Recent Labs    08/18/17 0610  08/18/17 1154 08/19/17 0324  WBC 11.4*  --  8.8 5.5  NEUTROABS 8.9*  --   --   --  HGB 14.9   < > 13.4 11.9*  HCT 46.5   < > 41.8 37.8*  MCV 79.4  --  79.2 78.6  PLT PLATELET CLUMPS NOTED ON SMEAR, COUNT APPEARS ADEQUATE  --  335 297   < > = values in this interval not displayed.   Basic Metabolic Panel Recent Labs    16/04/9601/01/19 0610 08/18/17 0655 08/18/17 1000 08/18/17 1154 08/19/17 0324  NA 135 137  --   --  136  K 4.2 4.0  --   --  3.8  CL 102 105  --   --  105  CO2 19*  --   --   --  22  GLUCOSE 112* 116*  --   --  103*  BUN 11 12  --   --  8  CREATININE 1.17 1.10  --  1.07 1.13  CALCIUM 9.3  --   --   --   8.8*  MG  --   --  2.1  --  1.9   Liver Function Tests Recent Labs    08/18/17 0610  AST 31  ALT 30  ALKPHOS 148*  BILITOT 0.7  PROT 8.1  ALBUMIN 3.6   Cardiac Enzymes Recent Labs    08/18/17 1154 08/18/17 1747 08/18/17 2331  TROPONINI 0.06* 0.06* 0.06*  _____________  Ct Angio Chest Pe W And/or Wo Contrast  Result Date: 08/18/2017 CLINICAL DATA:  Syncopal episode while using the bathroom. CABG approximately 3 weeks ago. EXAM: CT ANGIOGRAPHY CHEST WITH CONTRAST TECHNIQUE: Multidetector CT imaging of the chest was performed using the standard protocol during bolus administration of intravenous contrast. Multiplanar CT image reconstructions and MIPs were obtained to evaluate the vascular anatomy. CONTRAST:  <See Chart> ISOVUE-370 IOPAMIDOL (ISOVUE-370) INJECTION 76% COMPARISON:  Plain films of earlier today. FINDINGS: Cardiovascular: The quality of this exam for evaluation of pulmonary embolism is moderate. Limitations include mild motion and suboptimal bolus timing, with the bolus centered in the SVC. No pulmonary embolism to the lobar or large segmental level. Smaller emboli cannot be excluded. Normal aortic caliber. Mild cardiomegaly. Small pericardial effusion is likely physiologic. Status post median sternotomy. Retrosternal small volume relatively ill-defined edema and fluid, including on image 56/series 5. No well-defined fluid collection. Mediastinum/Nodes: No mediastinal or hilar adenopathy. Tiny hiatal hernia. Lungs/Pleura: Small left pleural effusion with minimal loculation including on image 106/series 5. No pneumothorax. Left worse than right base airspace disease. Upper Abdomen: Normal imaged portions of the liver, spleen, right adrenal gland. Musculoskeletal: Mild bilateral gynecomastia. Review of the MIP images confirms the above findings. IMPRESSION: 1. Moderate quality exam for evaluation of pulmonary embolism. No pulmonary embolism with limitations above. 2. Small left pleural  effusion with mild loculation. Bibasilar airspace disease. On the right, favored to represent atelectasis. On the left, this could represent atelectasis or mild infection. 3. Median sternotomy with ill-defined retrosternal fluid and edema, favored to be within normal variation in the postoperative state. No well-defined fluid collection. 4. Mild gynecomastia. 5.  Tiny hiatal hernia. Electronically Signed   By: Jeronimo GreavesKyle  Talbot M.D.   On: 08/18/2017 08:41   Dg Chest Port 1 View  Result Date: 08/18/2017 CLINICAL DATA:  Syncope and weakness. Recent coronary bypass surgery. EXAM: PORTABLE CHEST 1 VIEW COMPARISON:  None. FINDINGS: Postoperative changes in the mediastinum. Shallow inspiration. Cardiac enlargement. No vascular congestion or edema. No blunting of costophrenic angles. No pneumothorax. No focal consolidation or airspace disease. IMPRESSION: Cardiac enlargement.  No evidence of active pulmonary disease. Electronically Signed  By: Burman Nieves M.D.   On: 08/18/2017 06:41   Disposition   Pt is being discharged home today in good condition.  Follow-up Plans & Appointments    Follow-up Information    Lyn Records, MD Follow up on 08/25/2017.   Specialty:  Cardiology Why:  1:20 PM Contact information: [address] [place] [address] [phone]            Discharge Medications   Allergies as of 08/19/2017      Reactions   Augmentin [amoxicillin-pot Clavulanate] Other (See Comments)   Increased heart rate      Medication List    STOP taking these medications   furosemide 20 MG tablet Commonly known as:  LASIX   sacubitril-valsartan 24-26 MG Commonly known as:  ENTRESTO     TAKE these medications   albuterol 108 (90 Base) MCG/ACT inhaler Commonly known as:  PROVENTIL HFA;VENTOLIN HFA Inhale 2 puffs into the lungs every 6 (six) hours as needed for wheezing or shortness of breath.   aspirin 81 MG chewable tablet Chew 81 mg by mouth daily.     atorvastatin 40 MG tablet Commonly known as:  LIPITOR Take 40 mg by mouth daily.   calcium carbonate 500 MG chewable tablet Commonly known as:  TUMS - dosed in mg elemental calcium Chew 1 tablet by mouth daily as needed for indigestion or heartburn.   cetirizine 10 MG tablet Commonly known as:  ZYRTEC Take 10 mg by mouth daily as needed for allergies.   colchicine 0.6 MG tablet Take 0.6 mg by mouth daily.   famotidine 10 MG tablet Commonly known as:  PEPCID Take 10 mg by mouth daily as needed for heartburn or indigestion.   FLONASE 50 MCG/ACT nasal spray Generic drug:  fluticasone Place 1 spray into both nostrils daily as needed for allergies.   metoprolol succinate 50 MG 24 hr tablet Commonly known as:  TOPROL-XL Take 50 mg by mouth daily. Take with or immediately following a meal.   multivitamin tablet Take 1 tablet by mouth daily.       Aspirin prescribed at discharge?  Yes High Intensity Statin Prescribed? (Lipitor 40-80mg  or Crestor 20-40mg ): Yes Beta Blocker Prescribed? Yes For EF <40%, was ACEI/ARB Prescribed? No: hypotension ADP Receptor Inhibitor Prescribed? (i.e. Plavix etc.-Includes Medically Managed Patients): No: N/A For EF <40%, Aldosterone Inhibitor Prescribed? No: hypotensive Was EF assessed during THIS hospitalization? No: recently assessed @ OSH. Was Cardiac Rehab II ordered? (Included Medically managed Patients): No: N/A   Outstanding Labs/Studies   None  Duration of Discharge Encounter   Greater than 30 minutes including physician time.  Signed, Nicolasa Ducking NP 08/19/2017, 12:22 PM

## 2017-08-21 ENCOUNTER — Encounter: Payer: Self-pay | Admitting: *Deleted

## 2017-08-21 ENCOUNTER — Ambulatory Visit: Payer: Self-pay | Admitting: Interventional Cardiology

## 2017-08-21 ENCOUNTER — Encounter: Payer: Managed Care, Other (non HMO) | Attending: Family Medicine | Admitting: *Deleted

## 2017-08-21 VITALS — Ht 73.6 in | Wt 219.6 lb

## 2017-08-21 DIAGNOSIS — I213 ST elevation (STEMI) myocardial infarction of unspecified site: Secondary | ICD-10-CM | POA: Insufficient documentation

## 2017-08-21 DIAGNOSIS — Z7951 Long term (current) use of inhaled steroids: Secondary | ICD-10-CM | POA: Insufficient documentation

## 2017-08-21 DIAGNOSIS — Z951 Presence of aortocoronary bypass graft: Secondary | ICD-10-CM | POA: Insufficient documentation

## 2017-08-21 DIAGNOSIS — Z7982 Long term (current) use of aspirin: Secondary | ICD-10-CM | POA: Insufficient documentation

## 2017-08-21 DIAGNOSIS — Z79899 Other long term (current) drug therapy: Secondary | ICD-10-CM | POA: Insufficient documentation

## 2017-08-21 DIAGNOSIS — I2510 Atherosclerotic heart disease of native coronary artery without angina pectoris: Secondary | ICD-10-CM | POA: Insufficient documentation

## 2017-08-21 NOTE — Patient Instructions (Signed)
Patient Instructions  Patient Details  Name: Brad Lee MRN: [id number] Date of Birth: Sep 21, 1975 Referring Provider:  Henrine Screws, MD  Below are your personal goals for exercise, nutrition, and risk factors. Our goal is to help you stay on track towards obtaining and maintaining these goals. We will be discussing your progress on these goals with you throughout the program.  Initial Exercise Prescription: Initial Exercise Prescription - 08/21/17 1400      Date of Initial Exercise RX and Referring Provider   Date  08/21/17    Referring Provider  Verdis Prime MD      Treadmill   MPH  2    Grade  1    Minutes  15    METs  2.81      REL-XR   Level  3    Speed  50    Minutes  15    METs  2.5      T5 Nustep   Level  3    SPM  80    Minutes  15    METs  2.5      Prescription Details   Frequency (times per week)  3    Duration  Progress to 45 minutes of aerobic exercise without signs/symptoms of physical distress      Intensity   THRR 40-80% of Max Heartrate  115-159    Ratings of Perceived Exertion  11-13    Perceived Dyspnea  0-4      Progression   Progression  Continue to progress workloads to maintain intensity without signs/symptoms of physical distress.      Resistance Training   Training Prescription  Yes    Weight  2 lbs    Reps  10-15       Exercise Goals: Frequency: Be able to perform aerobic exercise two to three times per week in program working toward 2-5 days per week of home exercise.  Intensity: Work with a perceived exertion of 11 (fairly light) - 15 (hard) while following your exercise prescription.  We will make changes to your prescription with you as you progress through the program.   Duration: Be able to do 30 to 45 minutes of continuous aerobic exercise in addition to a 5 minute warm-up and a 5 minute cool-down routine.   Nutrition Goals: Your personal nutrition goals will be established when you do your nutrition analysis with the  dietician.  The following are general nutrition guidelines to follow: Cholesterol < 200mg /day Sodium < 1500mg /day Fiber: Men under 50 yrs - 38 grams per day  Personal Goals: Personal Goals and Risk Factors at Admission - 08/21/17 1329      Core Components/Risk Factors/Patient Goals on Admission    Weight Management  Yes;Weight Maintenance    Intervention  Weight Management: Develop a combined nutrition and exercise program designed to reach desired caloric intake, while maintaining appropriate intake of nutrient and fiber, sodium and fats, and appropriate energy expenditure required for the weight goal.;Weight Management: Provide education and appropriate resources to help participant work on and attain dietary goals. Loss about 20 pounds while in hospital and at discharge.  210 is goal.     Admit Weight  219 lb 9.6 oz (99.6 kg)    Goal Weight: Short Term  217 lb (98.4 kg)    Goal Weight: Long Term  210 lb (95.3 kg)    Expected Outcomes  Short Term: Continue to assess and modify interventions until short term weight is achieved;Long Term: Adherence to  nutrition and physical activity/exercise program aimed toward attainment of established weight goal    Heart Failure  Yes Wears a Life Vest.  Next echo in a few months    Intervention  Provide a combined exercise and nutrition program that is supplemented with education, support and counseling about heart failure. Directed toward relieving symptoms such as shortness of breath, decreased exercise tolerance, and extremity edema.    Expected Outcomes  Improve functional capacity of life;Short term: Attendance in program 2-3 days a week with increased exercise capacity. Reported lower sodium intake. Reported increased fruit and vegetable intake. Reports medication compliance.;Short term: Daily weights obtained and reported for increase. Utilizing diuretic protocols set by physician.;Long term: Adoption of self-care skills and reduction of barriers for  early signs and symptoms recognition and intervention leading to self-care maintenance.    Lipids  Yes    Intervention  Provide education and support for participant on nutrition & aerobic/resistive exercise along with prescribed medications to achieve LDL 70mg , HDL >40mg .    Expected Outcomes  Short Term: Participant states understanding of desired cholesterol values and is compliant with medications prescribed. Participant is following exercise prescription and nutrition guidelines.;Long Term: Cholesterol controlled with medications as prescribed, with individualized exercise RX and with personalized nutrition plan. Value goals: LDL < 70mg , HDL > 40 mg.       Tobacco Use Initial Evaluation: Social History   Tobacco Use  Smoking Status Never Smoker  Smokeless Tobacco Never Used    Exercise Goals and Review: Exercise Goals    Row Name 08/21/17 1446             Exercise Goals   Increase Physical Activity  Yes       Intervention  Provide advice, education, support and counseling about physical activity/exercise needs.;Develop an individualized exercise prescription for aerobic and resistive training based on initial evaluation findings, risk stratification, comorbidities and participant's personal goals.       Expected Outcomes  Short Term: Attend rehab on a regular basis to increase amount of physical activity.;Long Term: Add in home exercise to make exercise part of routine and to increase amount of physical activity.;Long Term: Exercising regularly at least 3-5 days a week.       Increase Strength and Stamina  Yes       Intervention  Provide advice, education, support and counseling about physical activity/exercise needs.;Develop an individualized exercise prescription for aerobic and resistive training based on initial evaluation findings, risk stratification, comorbidities and participant's personal goals.       Expected Outcomes  Short Term: Increase workloads from initial exercise  prescription for resistance, speed, and METs.;Short Term: Perform resistance training exercises routinely during rehab and add in resistance training at home;Long Term: Improve cardiorespiratory fitness, muscular endurance and strength as measured by increased METs and functional capacity (6MWT)       Able to understand and use rate of perceived exertion (RPE) scale  Yes       Intervention  Provide education and explanation on how to use RPE scale       Expected Outcomes  Short Term: Able to use RPE daily in rehab to express subjective intensity level;Long Term:  Able to use RPE to guide intensity level when exercising independently       Knowledge and understanding of Target Heart Rate Range (THRR)  Yes       Intervention  Provide education and explanation of THRR including how the numbers were predicted and where they are located for  reference       Expected Outcomes  Short Term: Able to state/look up THRR;Long Term: Able to use THRR to govern intensity when exercising independently;Short Term: Able to use daily as guideline for intensity in rehab       Able to check pulse independently  Yes       Intervention  Provide education and demonstration on how to check pulse in carotid and radial arteries.;Review the importance of being able to check your own pulse for safety during independent exercise       Expected Outcomes  Short Term: Able to explain why pulse checking is important during independent exercise;Long Term: Able to check pulse independently and accurately       Understanding of Exercise Prescription  Yes       Intervention  Provide education, explanation, and written materials on patient's individual exercise prescription       Expected Outcomes  Short Term: Able to explain program exercise prescription;Long Term: Able to explain home exercise prescription to exercise independently          Copy of goals given to participant.

## 2017-08-21 NOTE — Progress Notes (Signed)
Cardiac Individual Treatment Plan  Patient Details  Name: Brad Lee MRN: [id number] Date of Birth: 1975-11-05 Referring Provider:     Cardiac Rehab from 08/21/2017 in [facility] Cardiac and Pulmonary Rehab  Referring Provider  Daneen Schick MD      Initial Encounter Date:    Cardiac Rehab from 08/21/2017 in [facility] Cardiac and Pulmonary Rehab  Date  08/21/17  Referring Provider  Daneen Schick MD      Visit Diagnosis: ST elevation myocardial infarction (STEMI), unspecified artery ([place])  Patient's Home Medications on Admission:  Current Outpatient Medications:  .  aspirin 81 MG chewable tablet, Chew 81 mg by mouth daily., Disp: , Rfl:  .  atorvastatin (LIPITOR) 40 MG tablet, Take 40 mg by mouth daily., Disp: , Rfl:  .  calcium carbonate (TUMS - DOSED IN MG ELEMENTAL CALCIUM) 500 MG chewable tablet, Chew 1 tablet by mouth daily as needed for indigestion or heartburn. , Disp: , Rfl:  .  cetirizine (ZYRTEC) 10 MG tablet, Take 10 mg by mouth daily as needed for allergies. , Disp: , Rfl:  .  famotidine (PEPCID) 10 MG tablet, Take 10 mg by mouth daily as needed for heartburn or indigestion. , Disp: , Rfl:  .  fluticasone (FLONASE) 50 MCG/ACT nasal spray, Place 1 spray into both nostrils daily as needed for allergies. , Disp: , Rfl:  .  metoprolol succinate (TOPROL-XL) 50 MG 24 hr tablet, Take 50 mg by mouth daily. Take with or immediately following a meal., Disp: , Rfl:  .  Multiple Vitamin (MULTIVITAMIN) tablet, Take 1 tablet by mouth daily., Disp: , Rfl:  .  albuterol (PROVENTIL HFA;VENTOLIN HFA) 108 (90 Base) MCG/ACT inhaler, Inhale 2 puffs into the lungs every 6 (six) hours as needed for wheezing or shortness of breath., Disp: , Rfl:  .  colchicine 0.6 MG tablet, Take 0.6 mg by mouth daily., Disp: , Rfl:   Past Medical History: Past Medical History:  Diagnosis Date  . CAD (coronary artery disease)    a. 07/2017 s/p Ant STEMI/Cath [facility] [place]): LAD 100->attempt to open LAD failed, high grade dzs  in LCX/RCA--->CABG x 5 (LIMA->LAD, VG->Diag, VG->OM, VG->PDA->RPL.  Marland Kitchen HFrEF (heart failure with reduced ejection fraction) ([place])    a. 07/29/2017 Echo: EF 45%; b. 08/06/2017 TEE: EF 30-35%.  . Ischemic cardiomyopathy    a. 07/29/2017 Echo: EF 45%; b. 08/06/2017 TEE: EF 30-35%-->LifeVest placed following CABG.  . LVH (left ventricular hypertrophy)   . Mitral regurgitation   . Respiratory failure, acute ([place])   . 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 Syncope     Tobacco Use: Social History   Tobacco Use  Smoking Status Never Smoker  Smokeless Tobacco Never Used    Labs: Recent Review Scientist, physiological    Labs for ITP Cardiac and Pulmonary Rehab Latest Ref Rng & Units 08/18/2017   TCO2 22 - 32 mmol/L 21(L)       Exercise Target Goals: Date: 08/21/17  Exercise Program Goal: Individual exercise prescription set using results from initial 6 min walk test and THRR while considering  patient's activity barriers and safety.   Exercise Prescription Goal: Initial exercise prescription builds to 30-45 minutes a day of aerobic activity, 2-3 days per week.  Home exercise guidelines will be given to patient during program as part of exercise prescription that the participant will acknowledge.  Activity Barriers & Risk Stratification: Activity Barriers & Cardiac Risk Stratification - 08/21/17 1327      Activity Barriers & Cardiac  Risk Stratification   Activity Barriers  Other (comment);Deconditioning;Muscular Weakness    Comments  Sternal precautions    Cardiac Risk Stratification  High       6 Minute Walk: 6 Minute Walk    Row Name 08/21/17 1444         6 Minute Walk   Phase  Initial     Distance  1060 feet     Walk Time  6 minutes     # of Rest Breaks  0     MPH  2     METS  4.5     RPE  12     VO2 Peak  15.73     Symptoms  No     Resting HR  87 bpm     Resting BP  156/80 worried about LifeVest firing during test     Resting Oxygen Saturation   99 %     Exercise Oxygen Saturation   during 6 min walk  100 %     Max Ex. HR  120 bpm     Max Ex. BP  146/64     2 Minute Post BP  134/66        Oxygen Initial Assessment:   Oxygen Re-Evaluation:   Oxygen Discharge (Final Oxygen Re-Evaluation):   Initial Exercise Prescription: Initial Exercise Prescription - 08/21/17 1400      Date of Initial Exercise RX and Referring Provider   Date  08/21/17    Referring Provider  Daneen Schick MD      Treadmill   MPH  2    Grade  1    Minutes  15    METs  2.81      REL-XR   Level  3    Speed  50    Minutes  15    METs  2.5      T5 Nustep   Level  3    SPM  80    Minutes  15    METs  2.5      Prescription Details   Frequency (times per week)  3    Duration  Progress to 45 minutes of aerobic exercise without signs/symptoms of physical distress      Intensity   THRR 40-80% of Max Heartrate  115-159    Ratings of Perceived Exertion  11-13    Perceived Dyspnea  0-4      Progression   Progression  Continue to progress workloads to maintain intensity without signs/symptoms of physical distress.      Resistance Training   Training Prescription  Yes    Weight  2 lbs    Reps  10-15       Perform Capillary Blood Glucose checks as needed.  Exercise Prescription Changes: Exercise Prescription Changes    Row Name 08/21/17 1300             Response to Exercise   Blood Pressure (Admit)  156/80       Blood Pressure (Exercise)  142/64       Blood Pressure (Exit)  134/66       Heart Rate (Admit)  87 bpm       Heart Rate (Exercise)  120 bpm       Heart Rate (Exit)  97 bpm       Oxygen Saturation (Admit)  99 %       Oxygen Saturation (Exercise)  100 %       Rating of Perceived  Exertion (Exercise)  12       Perceived Dyspnea (Exercise)  -       Symptoms  none       Comments  walk test results          Exercise Comments:   Exercise Goals and Review: Exercise Goals    Row Name 08/21/17 1446             Exercise Goals   Increase Physical  Activity  Yes       Intervention  Provide advice, education, support and counseling about physical activity/exercise needs.;Develop an individualized exercise prescription for aerobic and resistive training based on initial evaluation findings, risk stratification, comorbidities and participant's personal goals.       Expected Outcomes  Short Term: Attend rehab on a regular basis to increase amount of physical activity.;Long Term: Add in home exercise to make exercise part of routine and to increase amount of physical activity.;Long Term: Exercising regularly at least 3-5 days a week.       Increase Strength and Stamina  Yes       Intervention  Provide advice, education, support and counseling about physical activity/exercise needs.;Develop an individualized exercise prescription for aerobic and resistive training based on initial evaluation findings, risk stratification, comorbidities and participant's personal goals.       Expected Outcomes  Short Term: Increase workloads from initial exercise prescription for resistance, speed, and METs.;Short Term: Perform resistance training exercises routinely during rehab and add in resistance training at home;Long Term: Improve cardiorespiratory fitness, muscular endurance and strength as measured by increased METs and functional capacity (6MWT)       Able to understand and use rate of perceived exertion (RPE) scale  Yes       Intervention  Provide education and explanation on how to use RPE scale       Expected Outcomes  Short Term: Able to use RPE daily in rehab to express subjective intensity level;Long Term:  Able to use RPE to guide intensity level when exercising independently       Knowledge and understanding of Target Heart Rate Range (THRR)  Yes       Intervention  Provide education and explanation of THRR including how the numbers were predicted and where they are located for reference       Expected Outcomes  Short Term: Able to state/look up THRR;Long  Term: Able to use THRR to govern intensity when exercising independently;Short Term: Able to use daily as guideline for intensity in rehab       Able to check pulse independently  Yes       Intervention  Provide education and demonstration on how to check pulse in carotid and radial arteries.;Review the importance of being able to check your own pulse for safety during independent exercise       Expected Outcomes  Short Term: Able to explain why pulse checking is important during independent exercise;Long Term: Able to check pulse independently and accurately       Understanding of Exercise Prescription  Yes       Intervention  Provide education, explanation, and written materials on patient's individual exercise prescription       Expected Outcomes  Short Term: Able to explain program exercise prescription;Long Term: Able to explain home exercise prescription to exercise independently          Exercise Goals Re-Evaluation :   Discharge Exercise Prescription (Final Exercise Prescription Changes): Exercise Prescription Changes - 08/21/17 1300  Response to Exercise   Blood Pressure (Admit)  156/80    Blood Pressure (Exercise)  142/64    Blood Pressure (Exit)  134/66    Heart Rate (Admit)  87 bpm    Heart Rate (Exercise)  120 bpm    Heart Rate (Exit)  97 bpm    Oxygen Saturation (Admit)  99 %    Oxygen Saturation (Exercise)  100 %    Rating of Perceived Exertion (Exercise)  12    Perceived Dyspnea (Exercise)  --    Symptoms  none    Comments  walk test results       Nutrition:  Target Goals: Understanding of nutrition guidelines, daily intake of sodium '1500mg'$ , cholesterol '200mg'$ , calories 30% from fat and 7% or less from saturated fats, daily to have 5 or more servings of fruits and vegetables.  Biometrics: Pre Biometrics - 08/21/17 1447      Pre Biometrics   Height  6' 1.6" (1.869 m)    Weight  219 lb 9.6 oz (99.6 kg)    Waist Circumference  40 inches    Hip Circumference   42 inches    Waist to Hip Ratio  0.95 %    BMI (Calculated)  28.52    Single Leg Stand  30 seconds        Nutrition Therapy Plan and Nutrition Goals: Nutrition Therapy & Goals - 08/21/17 1328      Intervention Plan   Intervention  Prescribe, educate and counsel regarding individualized specific dietary modifications aiming towards targeted core components such as weight, hypertension, lipid management, diabetes, heart failure and other comorbidities.    Expected Outcomes  Short Term Goal: Understand basic principles of dietary content, such as calories, fat, sodium, cholesterol and nutrients.;Short Term Goal: A plan has been developed with personal nutrition goals set during dietitian appointment.;Long Term Goal: Adherence to prescribed nutrition plan.       Nutrition Assessments: Nutrition Assessments - 08/21/17 1328      MEDFICTS Scores   Pre Score  6       Nutrition Goals Re-Evaluation:   Nutrition Goals Discharge (Final Nutrition Goals Re-Evaluation):   Psychosocial: Target Goals: Acknowledge presence or absence of significant depression and/or stress, maximize coping skills, provide positive support system. Participant is able to verbalize types and ability to use techniques and skills needed for reducing stress and depression.   Initial Review & Psychosocial Screening: Initial Psych Review & Screening - 08/21/17 1331      Initial Review   Current issues with  Current Stress Concerns    Source of Stress Concerns  Unable to participate in former interests or hobbies;Unable to perform yard/household activities;Chronic Illness;Occupation    Comments  Ira is fearful of his LifeVest firing inappropriately.  He has had it alarm on multiple occasions when nothing was really going on.  He has had it refitted multiple times.   It is frustrating to him to deal with.  Currently he is unable to drive and is relying on his wife for transportation, who has also been out of work  sicne his event.  They also have a 58 month old daughter, Judson Roch, that he wishes he could pick up and hold.  She is heavy handed and they were concerned about her hitting Daddy in the chest.  He really wants to be able to help care for his baby girl.  They take her to [facility] to stay with Mrs. Silversmith mother during appointments.  They are planning to  try to bring her to rehab to drop him off in the morning to see how that works for their schedule.  He is also worried about returning to work when it is time as he was keeping crazy hours working here and with his bosses in [place].  He was surviving on limited sleep (2-4 hrs) each night and working in a stressful job trying to prove himself since he was just rehired just before his daughter was born.  Previously, the company had laid him off for 6 months.  He is excited about starting the program and rebuidling his confidence and strength.       Family Dynamics   Good Support System?  Yes wife, family friends  pastor    Comments  Wife and family are very supportive. She flew out to [facility] to stay with him for a month during his hospitalization and recovery.        Barriers   Psychosocial barriers to participate in program  There are no identifiable barriers or psychosocial needs.;The patient should benefit from training in stress management and relaxation.      Screening Interventions   Interventions  Encouraged to exercise;To provide support and resources with identified psychosocial needs;Provide feedback about the scores to participant;Program counselor consult    Expected Outcomes  Short Term goal: Utilizing psychosocial counselor, staff and physician to assist with identification of specific Stressors or current issues interfering with healing process. Setting desired goal for each stressor or current issue identified.;Long Term Goal: Stressors or current issues are controlled or eliminated.;Short Term goal: Identification and review with participant of  any Quality of Life or Depression concerns found by scoring the questionnaire.;Long Term goal: The participant improves quality of Life and PHQ9 Scores as seen by post scores and/or verbalization of changes       Quality of Life Scores:  Quality of Life - 08/21/17 1333      Quality of Life Scores   Health/Function Pre  24.86 %    Socioeconomic Pre  29.14 %    Psych/Spiritual Pre  30 %    Family Pre  30 %    GLOBAL Pre  27.64 %      Scores of 19 and below usually indicate a poorer quality of life in these areas.  A difference of  2-3 points is a clinically meaningful difference.  A difference of 2-3 points in the total score of the Quality of Life Index has been associated with significant improvement in overall quality of life, self-image, physical symptoms, and general health in studies assessing change in quality of life.  PHQ-9: Recent Review Flowsheet Data    Depression screen [facility] 2/9 08/21/2017   Decreased Interest 0   Down, Depressed, Hopeless 0   PHQ - 2 Score 0   Altered sleeping 0   Tired, decreased energy 1   Change in appetite 1   Feeling bad or failure about yourself  0   Trouble concentrating 0   Moving slowly or fidgety/restless 0   Suicidal thoughts 0   PHQ-9 Score 2   Difficult doing work/chores Not difficult at all     Interpretation of Total Score  Total Score Depression Severity:  1-4 = Minimal depression, 5-9 = Mild depression, 10-14 = Moderate depression, 15-19 = Moderately severe depression, 20-27 = Severe depression   Psychosocial Evaluation and Intervention:   Psychosocial Re-Evaluation:   Psychosocial Discharge (Final Psychosocial Re-Evaluation):   Vocational Rehabilitation: Provide vocational rehab assistance to qualifying candidates.  Vocational Rehab Evaluation & Intervention: Vocational Rehab - 08/21/17 1335      Initial Vocational Rehab Evaluation & Intervention   Assessment shows need for Vocational Rehabilitation  No        Education: Education Goals: Education classes will be provided on a variety of topics geared toward better understanding of heart health and risk factor modification. Participant will state understanding/return demonstration of topics presented as noted by education test scores.  Learning Barriers/Preferences: Learning Barriers/Preferences - 08/21/17 1334      Learning Barriers/Preferences   Learning Barriers  None    Learning Preferences  Computer/Internet;Individual Instruction;Verbal Instruction;Video;Written Material       Education Topics:  AED/CPR: - Group verbal and written instruction with the use of models to demonstrate the basic use of the AED with the basic ABC's of resuscitation.   General Nutrition Guidelines/Fats and Fiber: -Group instruction provided by verbal, written material, models and posters to present the general guidelines for heart healthy nutrition. Gives an explanation and review of dietary fats and fiber.   Controlling Sodium/Reading Food Labels: -Group verbal and written material supporting the discussion of sodium use in heart healthy nutrition. Review and explanation with models, verbal and written materials for utilization of the food label.   Exercise Physiology & General Exercise Guidelines: - Group verbal and written instruction with models to review the exercise physiology of the cardiovascular system and associated critical values. Provides general exercise guidelines with specific guidelines to those with heart or lung disease.    Aerobic Exercise & Resistance Training: - Gives group verbal and written instruction on the various components of exercise. Focuses on aerobic and resistive training programs and the benefits of this training and how to safely progress through these programs..   Flexibility, Balance, Mind/Body Relaxation: Provides group verbal/written instruction on the benefits of flexibility and balance training, including  mind/body exercise modes such as yoga, pilates and tai chi.  Demonstration and skill practice provided.   Stress and Anxiety: - Provides group verbal and written instruction about the health risks of elevated stress and causes of high stress.  Discuss the correlation between heart/lung disease and anxiety and treatment options. Review healthy ways to manage with stress and anxiety.   Depression: - Provides group verbal and written instruction on the correlation between heart/lung disease and depressed mood, treatment options, and the stigmas associated with seeking treatment.   Anatomy & Physiology of the Heart: - Group verbal and written instruction and models provide basic cardiac anatomy and physiology, with the coronary electrical and arterial systems. Review of Valvular disease and Heart Failure   Cardiac Procedures: - Group verbal and written instruction to review commonly prescribed medications for heart disease. Reviews the medication, class of the drug, and side effects. Includes the steps to properly store meds and maintain the prescription regimen. (beta blockers and nitrates)   Cardiac Medications I: - Group verbal and written instruction to review commonly prescribed medications for heart disease. Reviews the medication, class of the drug, and side effects. Includes the steps to properly store meds and maintain the prescription regimen.   Cardiac Medications II: -Group verbal and written instruction to review commonly prescribed medications for heart disease. Reviews the medication, class of the drug, and side effects. (all other drug classes)    Go Sex-Intimacy & Heart Disease, Get SMART - Goal Setting: - Group verbal and written instruction through game format to discuss heart disease and the return to sexual intimacy. Provides group verbal and written material to  discuss and apply goal setting through the application of the S.M.A.R.T. Method.   Other Matters of the  Heart: - Provides group verbal, written materials and models to describe Stable Angina and Peripheral Artery. Includes description of the disease process and treatment options available to the cardiac patient.   Exercise & Equipment Safety: - Individual verbal instruction and demonstration of equipment use and safety with use of the equipment.   Cardiac Rehab from 08/21/2017 in [facility] Cardiac and Pulmonary Rehab  Date  08/21/17  Educator  [facility]  Instruction Review Code  1- Verbalizes Understanding      Infection Prevention: - Provides verbal and written material to individual with discussion of infection control including proper hand washing and proper equipment cleaning during exercise session.   Cardiac Rehab from 08/21/2017 in [facility] Cardiac and Pulmonary Rehab  Date  08/21/17  Educator  [facility]  Instruction Review Code  1- Verbalizes Understanding      Falls Prevention: - Provides verbal and written material to individual with discussion of falls prevention and safety.   Cardiac Rehab from 08/21/2017 in [facility] Cardiac and Pulmonary Rehab  Date  08/21/17  Educator  [facility]  Instruction Review Code  1- Verbalizes Understanding      Diabetes: - Individual verbal and written instruction to review signs/symptoms of diabetes, desired ranges of glucose level fasting, after meals and with exercise. Acknowledge that pre and post exercise glucose checks will be done for 3 sessions at entry of program.   Know Your Numbers and Risk Factors: -Group verbal and written instruction about important numbers in your health.  Discussion of what are risk factors and how they play a role in the disease process.  Review of Cholesterol, Blood Pressure, Diabetes, and BMI and the role they play in your overall health.   Sleep Hygiene: -Provides group verbal and written instruction about how sleep can affect your health.  Define sleep hygiene, discuss sleep cycles and impact of sleep habits. Review good sleep hygiene tips.     Other: -Provides group and verbal instruction on various topics (see comments)   Knowledge Questionnaire Score: Knowledge Questionnaire Score - 08/21/17 1334      Knowledge Questionnaire Score   Pre Score  22/28 Reviewed correct responses with Jacki Cones. He verbalized understanding of the responses and had no further questions today.       Core Components/Risk Factors/Patient Goals at Admission: Personal Goals and Risk Factors at Admission - 08/21/17 1329      Core Components/Risk Factors/Patient Goals on Admission    Weight Management  Yes;Weight Maintenance    Intervention  Weight Management: Develop a combined nutrition and exercise program designed to reach desired caloric intake, while maintaining appropriate intake of nutrient and fiber, sodium and fats, and appropriate energy expenditure required for the weight goal.;Weight Management: Provide education and appropriate resources to help participant work on and attain dietary goals. Loss about 20 pounds while in hospital and at discharge.  210 is goal.     Admit Weight  219 lb 9.6 oz (99.6 kg)    Goal Weight: Short Term  217 lb (98.4 kg)    Goal Weight: Long Term  210 lb (95.3 kg)    Expected Outcomes  Short Term: Continue to assess and modify interventions until short term weight is achieved;Long Term: Adherence to nutrition and physical activity/exercise program aimed toward attainment of established weight goal    Heart Failure  Yes Wears a Life Vest.  Next echo in a few months  Intervention  Provide a combined exercise and nutrition program that is supplemented with education, support and counseling about heart failure. Directed toward relieving symptoms such as shortness of breath, decreased exercise tolerance, and extremity edema.    Expected Outcomes  Improve functional capacity of life;Short term: Attendance in program 2-3 days a week with increased exercise capacity. Reported lower sodium intake. Reported increased fruit  and vegetable intake. Reports medication compliance.;Short term: Daily weights obtained and reported for increase. Utilizing diuretic protocols set by physician.;Long term: Adoption of self-care skills and reduction of barriers for early signs and symptoms recognition and intervention leading to self-care maintenance.    Lipids  Yes    Intervention  Provide education and support for participant on nutrition & aerobic/resistive exercise along with prescribed medications to achieve LDL '70mg'$ , HDL >'40mg'$ .    Expected Outcomes  Short Term: Participant states understanding of desired cholesterol values and is compliant with medications prescribed. Participant is following exercise prescription and nutrition guidelines.;Long Term: Cholesterol controlled with medications as prescribed, with individualized exercise RX and with personalized nutrition plan. Value goals: LDL < '70mg'$ , HDL > 40 mg.       Core Components/Risk Factors/Patient Goals Review:    Core Components/Risk Factors/Patient Goals at Discharge (Final Review):    ITP Comments: ITP Comments    Row Name 08/21/17 1321           ITP Comments  Medical review completed  ITP sent to Dr Sabra Heck for review , changes as needed and signature.  Documentation can be found in Media Tab Office Note from [place] 0/28/2019          Comments: Initial ITP

## 2017-08-23 DIAGNOSIS — I2510 Atherosclerotic heart disease of native coronary artery without angina pectoris: Secondary | ICD-10-CM | POA: Diagnosis not present

## 2017-08-23 DIAGNOSIS — I213 ST elevation (STEMI) myocardial infarction of unspecified site: Secondary | ICD-10-CM | POA: Diagnosis present

## 2017-08-23 DIAGNOSIS — Z7951 Long term (current) use of inhaled steroids: Secondary | ICD-10-CM | POA: Diagnosis not present

## 2017-08-23 DIAGNOSIS — Z7982 Long term (current) use of aspirin: Secondary | ICD-10-CM | POA: Diagnosis not present

## 2017-08-23 DIAGNOSIS — Z79899 Other long term (current) drug therapy: Secondary | ICD-10-CM | POA: Diagnosis not present

## 2017-08-23 DIAGNOSIS — Z951 Presence of aortocoronary bypass graft: Secondary | ICD-10-CM | POA: Diagnosis not present

## 2017-08-23 NOTE — Progress Notes (Signed)
Daily Session Note  Patient Details  Name: Brad Lee MRN: [id number] Date of Birth: 17-Mar-1976 Referring Provider:     Cardiac Rehab from 08/21/2017 in [facility] Cardiac and Pulmonary Rehab  Referring Provider  Daneen Schick MD      Encounter Date: 08/23/2017  Check In: Session Check In - 08/23/17 0803      Check-In   Location  [facility]-Cardiac & Pulmonary Rehab    Staff Present  Justin Mend RCP,RRT,BSRT;Heath Lark, RN, BSN, CCRP;Jessica Luan Pulling, MA, RCEP, CCRP, Exercise Physiologist    Supervising physician immediately available to respond to emergencies  See telemetry face sheet for immediately available ER MD    Medication changes reported      No    Fall or balance concerns reported     No    Warm-up and Cool-down  Performed on first and last piece of equipment    Resistance Training Performed  Yes    VAD Patient?  No      Pain Assessment   Currently in Pain?  No/denies          Social History   Tobacco Use  Smoking Status Never Smoker  Smokeless Tobacco Never Used    Goals Met:  Exercise tolerated well Personal goals reviewed No report of cardiac concerns or symptoms Strength training completed today  Goals Unmet:  Not Applicable  Comments: First full day of exercise!  Patient was oriented to gym and equipment including functions, settings, policies, and procedures.  Patient's individual exercise prescription and treatment plan were reviewed.  All starting workloads were established based on the results of the 6 minute walk test done at initial orientation visit.  The plan for exercise progression was also introduced and progression will be customized based on patient's performance and goals.   Dr. Emily Filbert is Medical Director for [place] and [facility].

## 2017-08-25 ENCOUNTER — Ambulatory Visit (INDEPENDENT_AMBULATORY_CARE_PROVIDER_SITE_OTHER): Payer: Managed Care, Other (non HMO) | Admitting: Interventional Cardiology

## 2017-08-25 ENCOUNTER — Encounter: Payer: Self-pay | Admitting: Interventional Cardiology

## 2017-08-25 ENCOUNTER — Other Ambulatory Visit: Payer: Managed Care, Other (non HMO) | Admitting: *Deleted

## 2017-08-25 ENCOUNTER — Ambulatory Visit: Payer: Managed Care, Other (non HMO) | Admitting: Interventional Cardiology

## 2017-08-25 VITALS — BP 96/68 | HR 91 | Ht 74.0 in | Wt 221.4 lb

## 2017-08-25 DIAGNOSIS — E785 Hyperlipidemia, unspecified: Secondary | ICD-10-CM

## 2017-08-25 DIAGNOSIS — I5022 Chronic systolic (congestive) heart failure: Secondary | ICD-10-CM

## 2017-08-25 DIAGNOSIS — I255 Ischemic cardiomyopathy: Secondary | ICD-10-CM | POA: Diagnosis not present

## 2017-08-25 DIAGNOSIS — I25119 Atherosclerotic heart disease of native coronary artery with unspecified angina pectoris: Secondary | ICD-10-CM

## 2017-08-25 DIAGNOSIS — I213 ST elevation (STEMI) myocardial infarction of unspecified site: Secondary | ICD-10-CM

## 2017-08-25 MED ORDER — SACUBITRIL-VALSARTAN 24-26 MG PO TABS: 1.0000 | ORAL_TABLET | Freq: Two times a day (BID) | ORAL | 6 refills | Status: DC

## 2017-08-25 NOTE — Progress Notes (Signed)
Cardiology Office Note    Date:  08/25/2017   ID:  Brad Lee, DOB Oct 14, 1975, MRN [id number]  PCP:  Henrine Screws, MD  Cardiologist: Lesleigh Noe, MD   No chief complaint on file.   History of Present Illness:  Brad Lee is a 42 y.o. male with no prior history of coronary disease who developed acute coronary syndrome July 25, 2017 in [place] .  After suffering an acute infarction, he underwent successful 5 vessel coronary bypass grafting with LIMA to LAD, SVG to diagonal, SVG to OM, sequential SVG to PDA and PLA.  Transesophageal echo several days after surgery demonstrated LVEF 30-35%.  Syncopal episode occurring upon returning to [facility] on 08/21/2017.  Brad Lee is improved.  He has no dyspnea.  He has not had angina.  Recently in the hospital because of dehydration and syncope.  Diuretic therapy was discontinued.  He is starting cardiac rehab.  In rehab he is running blood pressures in the 120-140 mmHg systolic range.  On today's exam asymmetric blood pressures are no longer present.  No significant sternal soreness.  No orthopnea or PND.  No palpitations.  He has been prescribed a LifeVest, it has repeatedly double counted his heart rate and given warnings of preeminent device therapy.  He disengages the device frequently.   Past Medical History:  Diagnosis Date  . CAD (coronary artery disease)    a. 07/2017 s/p Ant STEMI/Cath [facility], [place]): LAD 100->attempt to open LAD failed, high grade dzs in LCX/RCA--->CABG x 5 (LIMA->LAD, VG->Diag, VG->OM, VG->PDA->RPL.  Marland Kitchen HFrEF (heart failure with reduced ejection fraction) (HCC)    a. 07/29/2017 Echo: EF 45%; b. 08/06/2017 TEE: EF 30-35%.  . Ischemic cardiomyopathy    a. 07/29/2017 Echo: EF 45%; b. 08/06/2017 TEE: EF 30-35%-->LifeVest placed following CABG.  . LVH (left ventricular hypertrophy)   . Mitral regurgitation   . Respiratory failure, acute (HCC)   . 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 Syncope     Past Surgical  History:  Procedure Laterality Date  . CORONARY ARTERY BYPASS GRAFT  07/28/2017  . TONSILLECTOMY      Current Medications: Outpatient Medications Prior to Visit  Medication Sig Dispense Refill  . albuterol (PROVENTIL HFA;VENTOLIN HFA) 108 (90 Base) MCG/ACT inhaler Inhale 2 puffs into the lungs every 6 (six) hours as needed for wheezing or shortness of breath.    Marland Kitchen aspirin 81 MG chewable tablet Chew 81 mg by mouth daily.    Marland Kitchen atorvastatin (LIPITOR) 40 MG tablet Take 40 mg by mouth daily.    . calcium carbonate (TUMS - DOSED IN MG ELEMENTAL CALCIUM) 500 MG chewable tablet Chew 1 tablet by mouth daily as needed for indigestion or heartburn.     . cetirizine (ZYRTEC) 10 MG tablet Take 10 mg by mouth daily as needed for allergies.     Marland Kitchen colchicine 0.6 MG tablet Take 0.6 mg by mouth daily.    . famotidine (PEPCID) 10 MG tablet Take 10 mg by mouth daily as needed for heartburn or indigestion.     . fluticasone (FLONASE) 50 MCG/ACT nasal spray Place 1 spray into both nostrils daily as needed for allergies.     . metoprolol succinate (TOPROL-XL) 50 MG 24 hr tablet Take 50 mg by mouth daily. Take with or immediately following a meal.    . Multiple Vitamin (MULTIVITAMIN) tablet Take 1 tablet by mouth daily.     No facility-administered medications prior to visit.  Allergies:   Augmentin [amoxicillin-pot clavulanate]   Social History   Socioeconomic History  . Marital status: Married    Spouse name: None  . Number of children: 0  . Years of education: None  . Highest education level: None  Social Needs  . Financial resource strain: None  . Food insecurity - worry: None  . Food insecurity - inability: None  . Transportation needs - medical: None  . Transportation needs - non-medical: None  Occupational History  . Occupation: OPERATIONS MANAGER  Tobacco Use  . Smoking status: Never Smoker  . Smokeless tobacco: Never Used  Substance and Sexual Activity  . Alcohol use: No     Frequency: Never  . Drug use: No  . Sexual activity: Not Currently  Other Topics Concern  . None  Social History Narrative  . None     Family History:  The patient's family history includes Healthy in his father and mother.   ROS:   Please see the history of present illness.    Diarrhea and weakness have resolved. All other systems reviewed and are negative.   PHYSICAL EXAM:   VS:  BP 96/68   Pulse 91   Ht 6\' 2"  (1.88 m)   Wt 221 lb 6.4 oz (100.4 kg)   BMI 28.43 kg/m     Personally recorded blood pressure 115/78 mmHg in both arms. GEN: Well nourished, well developed, in no acute distress  HEENT: normal  Neck: no JVD, carotid bruits, or masses Cardiac: RRR; no murmurs, rubs, or gallops,no edema  Respiratory:  clear to auscultation bilaterally, normal work of breathing GI: soft, nontender, nondistended, + BS MS: no deformity or atrophy  Skin: warm and dry, no rash Neuro:  Alert and Oriented x 3, Strength and sensation are intact Psych: euthymic mood, full affect  Wt Readings from Last 3 Encounters:  08/25/17 221 lb 6.4 oz (100.4 kg)  08/21/17 219 lb 9.6 oz (99.6 kg)  08/19/17 213 lb 14.4 oz (97 kg)      Studies/Labs Reviewed:   EKG:  EKG not repeated  Recent Labs: 08/18/2017: ALT 30; B Natriuretic Peptide 201.0 08/19/2017: BUN 8; Creatinine, Ser 1.13; Hemoglobin 11.9; Magnesium 1.9; Platelets 297; Potassium 3.8; Sodium 136   Lipid Panel No results found for: CHOL, TRIG, HDL, CHOLHDL, VLDL, LDLCALC, LDLDIRECT  Additional studies/ records that were reviewed today include:  No new data    ASSESSMENT:    1. Chronic systolic heart failure (HCC)   2. Coronary artery disease involving native coronary artery of native heart with angina pectoris (HCC)   3. Ischemic cardiomyopathy   4. Hyperlipidemia with target LDL less than 70      PLAN:  In order of problems listed above:  1. Last assessed LVEF was 30-35% by TEE.  He has no evidence of heart failure.   Exertional tolerance is excellent in cardiac rehab.  Start Entresto 24/26 mg 1/day for 3 days and then if no hypotension or side effects, increase to twice daily.  After twice daily therapy, check basic metabolic panel 7 days later.  Clinical follow-up in 3-4 weeks. 2. He denies any symptoms of suggest ischemia. 3. We will optimize heart failure therapy to hopefully improve LV function.  Already on metoprolol succinate.  We will continue the current dose.  Start [place]resto as outlined. 4. LDL target less than 70.  Will need blood work done in 4-6 weeks.  Liver and lipid panel will be done today.  Clinical follow-up with me in  3-4 weeks.  Continue cardiac rehab.  Wear LifeVest as much as possible.  If warnings occur and there are no symptoms disengaged the device.  Okay to start driving but cannot wear a LifeVest while driving.    Medication Adjustments/Labs and Tests Ordered: Current medicines are reviewed at length with the patient today.  Concerns regarding medicines are outlined above.  Medication changes, Labs and Tests ordered today are listed in the Patient Instructions below. There are no Patient Instructions on file for this visit.   Signed, Lesleigh Noe, MD  08/25/2017 2:06 PM    [facility] [address] [place], [place], [place]  [address] Phone: [phone]; Fax: [phone]

## 2017-08-25 NOTE — Patient Instructions (Signed)
Medication Instructions:  1) START Entresto 24/26 once daily for 3 days, then if you are feeling ok, increase to twice daily.  Labwork: Your physician recommends that you return for lab work in: 10 days (BMET)   Testing/Procedures: None  Follow-Up: Your physician recommends that you schedule a follow-up appointment in: 3 weeks with Dr. Katrinka BlazingSmith (can have 11:40A on 09/18/17)   Any Other Special Instructions Will Be Listed Below (If Applicable).     If you need a refill on your cardiac medications before your next appointment, please call your pharmacy.

## 2017-08-25 NOTE — Progress Notes (Signed)
Daily Session Note  Patient Details  Name: Brad Lee MRN: [id number] Date of Birth: 1975-10-30 Referring Provider:     Cardiac Rehab from 08/21/2017 in [facility] Cardiac and Pulmonary Rehab  Referring Provider  Daneen Schick MD      Encounter Date: 08/25/2017  Check In: Session Check In - 08/25/17 0852      Check-In   Location  [facility]-Cardiac & Pulmonary Rehab    Staff Present  Alberteen Sam, MA, RCEP, CCRP, Exercise Physiologist;Meredith Sherryll Burger, RN Vickki Hearing, BA, ACSM CEP, Exercise Physiologist    Supervising physician immediately available to respond to emergencies  See telemetry face sheet for immediately available ER MD    Medication changes reported      No    Fall or balance concerns reported     No    Warm-up and Cool-down  Performed on first and last piece of equipment    Resistance Training Performed  Yes    VAD Patient?  No      Pain Assessment   Currently in Pain?  No/denies    Multiple Pain Sites  No          Social History   Tobacco Use  Smoking Status Never Smoker  Smokeless Tobacco Never Used    Goals Met:  Independence with exercise equipment Exercise tolerated well No report of cardiac concerns or symptoms Strength training completed today  Goals Unmet:  Not Applicable  Comments: Pt able to follow exercise prescription today without complaint.  Will continue to monitor for progression.    Dr. Emily Filbert is Medical Director for [place] and [facility].

## 2017-08-26 LAB — COMPREHENSIVE METABOLIC PANEL
A/G RATIO: 1.1 — AB (ref 1.2–2.2)
ALT: 29 IU/L (ref 0–44)
AST: 23 IU/L (ref 0–40)
Albumin: 4 g/dL (ref 3.5–5.5)
Alkaline Phosphatase: 120 IU/L — ABNORMAL HIGH (ref 39–117)
BILIRUBIN TOTAL: 0.4 mg/dL (ref 0.0–1.2)
BUN/Creatinine Ratio: 9 (ref 9–20)
BUN: 10 mg/dL (ref 6–20)
CALCIUM: 9.5 mg/dL (ref 8.7–10.2)
CO2: 20 mmol/L (ref 20–29)
Chloride: 103 mmol/L (ref 96–106)
Creatinine, Ser: 1.16 mg/dL (ref 0.76–1.27)
GFR calc Af Amer: 91 mL/min/{1.73_m2} (ref >59)
GFR, EST NON AFRICAN AMERICAN: 79 mL/min/{1.73_m2} (ref >59)
GLOBULIN, TOTAL: 3.8 g/dL (ref 1.5–4.5)
Glucose: 89 mg/dL (ref 65–99)
POTASSIUM: 4.3 mmol/L (ref 3.5–5.2)
SODIUM: 140 mmol/L (ref 134–144)
Total Protein: 7.8 g/dL (ref 6.0–8.5)

## 2017-08-26 LAB — LIPID PANEL
CHOL/HDL RATIO: 4.2 ratio (ref 0.0–5.0)
Cholesterol, Total: 113 mg/dL (ref 100–199)
HDL: 27 mg/dL — ABNORMAL LOW (ref >39)
LDL Calculated: 51 mg/dL (ref 0–99)
Triglycerides: 175 mg/dL — ABNORMAL HIGH (ref 0–149)
VLDL Cholesterol Cal: 35 mg/dL (ref 5–40)

## 2017-08-26 LAB — HEMOGLOBIN A1C
Est. average glucose Bld gHb Est-mCnc: 120 mg/dL
HEMOGLOBIN A1C: 5.8 % — AB (ref 4.8–5.6)

## 2017-08-28 ENCOUNTER — Encounter: Payer: Managed Care, Other (non HMO) | Admitting: *Deleted

## 2017-08-28 DIAGNOSIS — I213 ST elevation (STEMI) myocardial infarction of unspecified site: Secondary | ICD-10-CM | POA: Diagnosis not present

## 2017-08-28 NOTE — Progress Notes (Signed)
Daily Session Note  Patient Details  Name: Brad Lee MRN: [id number] Date of Birth: 05/28/1976 Referring Provider:     Cardiac Rehab from 08/21/2017 in [facility] Cardiac and Pulmonary Rehab  Referring Provider  Daneen Schick MD      Encounter Date: 08/28/2017  Check In: Session Check In - 08/28/17 0803      Check-In   Location  [facility]-Cardiac & Pulmonary Rehab    Staff Present  Alberteen Sam, MA, RCEP, CCRP, Exercise Physiologist;Kelly Amedeo Plenty, BS, ACSM CEP, Exercise Physiologist;Susanne Bice, RN, BSN, CCRP    Supervising physician immediately available to respond to emergencies  See telemetry face sheet for immediately available ER MD    Medication changes reported      No    Fall or balance concerns reported     No    Warm-up and Cool-down  Performed on first and last piece of equipment    Resistance Training Performed  Yes    VAD Patient?  No      Pain Assessment   Currently in Pain?  No/denies          Social History   Tobacco Use  Smoking Status Never Smoker  Smokeless Tobacco Never Used    Goals Met:  Independence with exercise equipment Exercise tolerated well No report of cardiac concerns or symptoms Strength training completed today  Goals Unmet:  Not Applicable  Comments: Pt able to follow exercise prescription today without complaint.  Will continue to monitor for progression.    Dr. Emily Filbert is Medical Director for [place] and [facility].

## 2017-08-29 ENCOUNTER — Telehealth: Payer: Self-pay | Admitting: Interventional Cardiology

## 2017-08-29 ENCOUNTER — Encounter: Payer: Self-pay | Admitting: Interventional Cardiology

## 2017-08-29 MED ORDER — ASPIRIN 81 MG PO CHEW: 81.0000 mg | CHEWABLE_TABLET | Freq: Every day | ORAL | 3 refills | Status: DC

## 2017-08-29 MED ORDER — METOPROLOL SUCCINATE ER 50 MG PO TB24: 50.0000 mg | ORAL_TABLET | Freq: Every day | ORAL | 3 refills | Status: DC

## 2017-08-29 NOTE — Telephone Encounter (Signed)
New Message     Pt c/o medication issue:  1. Name of Medication:  colchicine 0.6 MG tablet Take 0.6 mg by mouth daily.    2. How are you currently taking this medication (dosage and times per day)? 1x a day   3. Are you having a reaction (difficulty breathing--STAT)? no  4. What is your medication issue? Is patient suppose to continue taking this medication? He was given it for 15 day, if he needs to continue taking it he will need a refill -  [facility] [address] - 30 or 90 is fine

## 2017-08-29 NOTE — Telephone Encounter (Signed)
Spoke with Dr. Katrinka BlazingSmith and he said pt did not need to continue Colchicine.  Spoke with wife, DPR on file.  She is aware pt can discontinue Colchicine once he runs out.  Pt only has one pill left. Wife appreciative for call.

## 2017-08-30 ENCOUNTER — Telehealth: Payer: Self-pay | Admitting: Interventional Cardiology

## 2017-08-30 DIAGNOSIS — I213 ST elevation (STEMI) myocardial infarction of unspecified site: Secondary | ICD-10-CM | POA: Diagnosis not present

## 2017-08-30 NOTE — Telephone Encounter (Signed)
Spoke with pt and made him aware that his MyChart message from yesterday about a letter has been sent to Dr. Katrinka BlazingSmith.  Advised once he send me back the information I can prepare the letter and load it to MyChart for him to access.  Advised when it is ready I will call him to let him know.  Pt appreciative for call.

## 2017-08-30 NOTE — Telephone Encounter (Signed)
New Message   Patient is calling in reference to a return to work note. Please call to discuss.

## 2017-08-30 NOTE — Progress Notes (Signed)
Daily Session Note  Patient Details  Name: Brad Lee  MRN: [id number] Date of Birth: 26-May-1976 Referring Provider:     Cardiac Rehab from 08/21/2017 in [facility] Cardiac and Pulmonary Rehab  Referring Provider  Daneen Schick MD      Encounter Date: 08/30/2017  Check In: Session Check In - 08/30/17 0743      Check-In   Location  [facility]-Cardiac & Pulmonary Rehab    Staff Present  Alberteen Sam, MA, RCEP, CCRP, Exercise Physiologist;Susanne Bice, RN, BSN, CCRP;Kohle Winner Flavia Shipper    Supervising physician immediately available to respond to emergencies  See telemetry face sheet for immediately available ER MD    Medication changes reported      No    Fall or balance concerns reported     No    Warm-up and Cool-down  Performed on first and last piece of equipment    Resistance Training Performed  Yes    VAD Patient?  No      Pain Assessment   Currently in Pain?  No/denies          Social History   Tobacco Use  Smoking Status Never Smoker  Smokeless Tobacco Never Used    Goals Met:  Independence with exercise equipment Exercise tolerated well No report of cardiac concerns or symptoms Strength training completed today  Goals Unmet:  Not Applicable  Comments: Pt able to follow exercise prescription today without complaint.  Will continue to monitor for progression.   Dr. Emily Filbert is Medical Director for [place] and [facility].

## 2017-09-01 ENCOUNTER — Encounter: Payer: Managed Care, Other (non HMO) | Admitting: *Deleted

## 2017-09-01 DIAGNOSIS — I213 ST elevation (STEMI) myocardial infarction of unspecified site: Secondary | ICD-10-CM

## 2017-09-01 NOTE — Progress Notes (Signed)
Daily Session Note  Patient Details  Name: Brad Lee MRN: [id number] Date of Birth: 15-Dec-1975 Referring Provider:     Cardiac Rehab from 08/21/2017 in [facility] Cardiac and Pulmonary Rehab  Referring Provider  Daneen Schick MD      Encounter Date: 09/01/2017  Check In: Session Check In - 09/01/17 0755      Check-In   Location  [facility]-Cardiac & Pulmonary Rehab    Staff Present  Alberteen Sam, MA, RCEP, CCRP, Exercise Physiologist;Meredith Sherryll Burger, RN Vickki Hearing, BA, ACSM CEP, Exercise Physiologist    Supervising physician immediately available to respond to emergencies  See telemetry face sheet for immediately available ER MD    Medication changes reported      No    Fall or balance concerns reported     No    Warm-up and Cool-down  Performed on first and last piece of equipment    Resistance Training Performed  Yes    VAD Patient?  No      Pain Assessment   Currently in Pain?  No/denies          Social History   Tobacco Use  Smoking Status Never Smoker  Smokeless Tobacco Never Used    Goals Met:  Independence with exercise equipment Exercise tolerated well Personal goals reviewed No report of cardiac concerns or symptoms Strength training completed today  Goals Unmet:  Not Applicable  Comments: Pt able to follow exercise prescription today without complaint.  Will continue to monitor for progression. Reviewed home exercise with pt today.  Pt plans to use equipment he has at home for exercise.  Usama has a rowing machine, a bike, and a cross elliptical for use at home.  He also has some weights he can use at home as well.  Reviewed THR, pulse, RPE, sign and symptoms, NTG use, and when to call 911 or MD.  Also discussed weather considerations and indoor options.  Pt voiced understanding.    Dr. Emily Filbert is Medical Director for [place] and [facility].

## 2017-09-04 ENCOUNTER — Encounter: Payer: Managed Care, Other (non HMO) | Admitting: *Deleted

## 2017-09-04 ENCOUNTER — Other Ambulatory Visit: Payer: Managed Care, Other (non HMO) | Admitting: *Deleted

## 2017-09-04 DIAGNOSIS — I5022 Chronic systolic (congestive) heart failure: Secondary | ICD-10-CM

## 2017-09-04 DIAGNOSIS — I213 ST elevation (STEMI) myocardial infarction of unspecified site: Secondary | ICD-10-CM

## 2017-09-04 LAB — BASIC METABOLIC PANEL
BUN/Creatinine Ratio: 10 (ref 9–20)
BUN: 11 mg/dL (ref 6–20)
CHLORIDE: 100 mmol/L (ref 96–106)
CO2: 23 mmol/L (ref 20–29)
Calcium: 9.4 mg/dL (ref 8.7–10.2)
Creatinine, Ser: 1.1 mg/dL (ref 0.76–1.27)
GFR calc Af Amer: 97 mL/min/{1.73_m2} (ref >59)
GFR calc non Af Amer: 84 mL/min/{1.73_m2} (ref >59)
GLUCOSE: 108 mg/dL — AB (ref 65–99)
Potassium: 4.4 mmol/L (ref 3.5–5.2)
SODIUM: 140 mmol/L (ref 134–144)

## 2017-09-04 NOTE — Progress Notes (Signed)
Daily Session Note  Patient Details  Name: Brad Lee MRN: [id number] Date of Birth: Sep 23, 1975 Referring Provider:     Cardiac Rehab from 08/21/2017 in [facility] Cardiac and Pulmonary Rehab  Referring Provider  Daneen Schick MD      Encounter Date: 09/04/2017  Check In: Session Check In - 09/04/17 0802      Check-In   Location  [facility]Cardiac [facility] Pulmonary Rehab    Staff Present  Alberteen Sam, MA, RCEP, CCRP, Exercise Physiologist;Fernado Brigante Amedeo Plenty, BS, ACSM CEP, Exercise Physiologist;Susanne Bice, RN, BSN, CCRP    Supervising physician immediately available to respond to emergencies  See telemetry face sheet for immediately available ER MD    Medication changes reported      No    Fall or balance concerns reported     No    Warm-up and Cool-down  Performed on first and last piece of equipment    Resistance Training Performed  Yes    VAD Patient?  No      Pain Assessment   Currently in Pain?  No/denies    Multiple Pain Sites  No          Social History   Tobacco Use  Smoking Status Never Smoker  Smokeless Tobacco Never Used    Goals Met:  Independence with exercise equipment Exercise tolerated well No report of cardiac concerns or symptoms Strength training completed today  Goals Unmet:  Not Applicable  Comments: Pt able to follow exercise prescription today without complaint.  Will continue to monitor for progression.    Dr. Emily Filbert is Medical Director for [place] and [facility].

## 2017-09-06 ENCOUNTER — Encounter: Payer: Self-pay | Admitting: *Deleted

## 2017-09-06 DIAGNOSIS — I213 ST elevation (STEMI) myocardial infarction of unspecified site: Secondary | ICD-10-CM

## 2017-09-06 NOTE — Progress Notes (Signed)
Daily Session Note  Patient Details  Name: Brad Lee MRN: [id number] Date of Birth: [birth year] Referring Provider:     Cardiac Rehab from 08/21/2017 in [facility] Cardiac and Pulmonary Rehab  Referring Provider  Daneen Schick MD      Encounter Date: 09/06/2017  Check In: Session Check In - 09/06/17 0845      Check-In   Location  [facility] Re[facility]    Staff Present  Alberteen Sam, MA, RCEP, CCRP, Exercise Physiologist;Susanne Bice, RN, BSN, CCRP;Joseph Flavia Shipper    Supervising physician immediately available to respond to emergencies  See telemetry face sheet for immediately available ER MD    Medication changes reported      No    Fall or balance concerns reported     No    Warm-up and Cool-down  Performed on first and last piece of equipment    Resistance Training Performed  Yes    VAD Patient?  No      Pain Assessment   Currently in Pain?  No/denies        Exercise Prescription Changes - 09/05/17 1400      Response to Exercise   Blood Pressure (Admit)  120/64    Blood Pressure (Exercise)  126/82    Blood Pressure (Exit)  102/70    Heart Rate (Admit)  95 bpm    Heart Rate (Exercise)  111 bpm    Heart Rate (Exit)  90 bpm    Rating of Perceived Exertion (Exercise)  12    Symptoms  none LifeVest continues to alarm despite no rhythm issues    Duration  Continue with 45 min of aerobic exercise without signs/symptoms of physical distress.    Intensity  THRR unchanged      Progression   Progression  Continue to progress workloads to maintain intensity without signs/symptoms of physical distress.    Average METs  3.17      Resistance Training   Training Prescription  Yes    Weight  5 lbs    Reps  10-15      Interval Training   Interval Training  No      Treadmill   MPH  3    Grade  1    Minutes  15    METs  3.71      REL-XR   Level  4    Minutes  15    METs  3.3      T5 Nustep   Level  5    Minutes  15    METs  2.5      Home Exercise  Plan   Plans to continue exercise at  Home (comment) walking, ellipitical, rower, bike    Frequency  Add 3 additional days to program exercise sessions.    Initial Home Exercises Provided  09/01/17       Social History   Tobacco Use  Smoking Status Never Smoker  Smokeless Tobacco Never Used    Goals Met:  Independence with exercise equipment Exercise tolerated well No report of cardiac concerns or symptoms Strength training completed today  Goals Unmet:  Not Applicable  Comments: Pt able to follow exercise prescription today without complaint.  Will continue to monitor for progression. Avari no longer has to wear his LifeVest.  They reviewed the records yesterday and found that it had been double counting his rate.  The rep contacted Dr. Tamala Julian and Dr. Caryl Comes to review results.  After discussion with physicians, Autry was  released from having to wear the LifeVest.    Dr. Emily Filbert is Medical Director for [place] and [facility].

## 2017-09-06 NOTE — Telephone Encounter (Signed)
Letter prepared.  Advised pt I have released letter to MyChart and am mailing him a hard copy as well.  Pt verbalized understanding and was appreciative for call.

## 2017-09-08 ENCOUNTER — Telehealth: Payer: Self-pay | Admitting: Interventional Cardiology

## 2017-09-08 ENCOUNTER — Ambulatory Visit: Payer: Self-pay | Admitting: Interventional Cardiology

## 2017-09-08 NOTE — Telephone Encounter (Signed)
Would recommend Tamiflu 75mg  once daily for prophylactic dosing. Continue for 1 week after last flu exposure (if vaccinated) or 2 weeks (if unvaccinated).

## 2017-09-08 NOTE — Telephone Encounter (Signed)
Brad Lee is calling because his 11mth old daughter has the flu and was told to contact Dr. Katrinka BlazingSmith and see what medications he can take so that he will not get the Flu since he has been exposed to it . Also he missed today rehab because he had to take his daughter to the doctor and not sure if he should go because he has been exposed to the flu . Please call

## 2017-09-08 NOTE — Telephone Encounter (Signed)
Spoke with Dr. Katrinka BlazingSmith and ok for pt to use Tamiflu. Advised pt of this and he will contact PCP for prescription.  Pt also wanted to know about possibly using Elderberry Syrup.  Advised I would have to ask my Pharmacist about this and they were all out of the office today.  Advised I would call and let him know as soon as they were able to answer.  Pt appreciative for call.

## 2017-09-08 NOTE — Telephone Encounter (Signed)
Ok to use Elderberry syrup as well.

## 2017-09-08 NOTE — Telephone Encounter (Signed)
Spoke with pt and advised him of information provided by Margaretmary DysMegan Supple, RPH.  Pt appreciative for call.

## 2017-09-08 NOTE — Telephone Encounter (Signed)
Will route to Dr. Katrinka BlazingSmith and pharmacists for review.

## 2017-09-13 ENCOUNTER — Encounter: Payer: Self-pay | Admitting: *Deleted

## 2017-09-13 DIAGNOSIS — I213 ST elevation (STEMI) myocardial infarction of unspecified site: Secondary | ICD-10-CM

## 2017-09-13 NOTE — Progress Notes (Signed)
Cardiac Individual Treatment Plan  Patient Details  Name: Christapher Gillian MRN: [id number] Date of Birth: 1975/12/12 Referring Provider:     Cardiac Rehab from 08/21/2017 in [facility] Cardiac and Pulmonary Rehab  Referring Provider  Daneen Schick MD      Initial Encounter Date:    Cardiac Rehab from 08/21/2017 in [facility] Cardiac and Pulmonary Rehab  Date  08/21/17  Referring Provider  Daneen Schick MD      Visit Diagnosis: ST elevation myocardial infarction (STEMI), unspecified artery ([place])  Patient's Home Medications on Admission:  Current Outpatient Medications:  .  albuterol (PROVENTIL HFA;VENTOLIN HFA) 108 (90 Base) MCG/ACT inhaler, Inhale 2 puffs into the lungs every 6 (six) hours as needed for wheezing or shortness of breath., Disp: , Rfl:  .  aspirin 81 MG chewable tablet, Chew 1 tablet (81 mg total) by mouth daily., Disp: 90 tablet, Rfl: 3 .  atorvastatin (LIPITOR) 40 MG tablet, Take 40 mg by mouth daily., Disp: , Rfl:  .  calcium carbonate (TUMS - DOSED IN MG ELEMENTAL CALCIUM) 500 MG chewable tablet, Chew 1 tablet by mouth daily as needed for indigestion or heartburn. , Disp: , Rfl:  .  cetirizine (ZYRTEC) 10 MG tablet, Take 10 mg by mouth daily as needed for allergies. , Disp: , Rfl:  .  famotidine (PEPCID) 10 MG tablet, Take 10 mg by mouth daily as needed for heartburn or indigestion. , Disp: , Rfl:  .  fluticasone (FLONASE) 50 MCG/ACT nasal spray, Place 1 spray into both nostrils daily as needed for allergies. , Disp: , Rfl:  .  metoprolol succinate (TOPROL-XL) 50 MG 24 hr tablet, Take 1 tablet (50 mg total) by mouth daily. Take with or immediately following a meal., Disp: 90 tablet, Rfl: 3 .  Multiple Vitamin (MULTIVITAMIN) tablet, Take 1 tablet by mouth daily., Disp: , Rfl:  .  sacubitril-valsartan (ENTRESTO) 24-26 MG, Take 1 tablet by mouth 2 (two) times daily., Disp: 60 tablet, Rfl: 6  Past Medical History: Past Medical History:  Diagnosis Date  . CAD (coronary artery disease)     a. 07/2017 s/p Ant STEMI/[facility], [place]): LAD 100->attempt to open LAD failed, high grade dzs in LCX/RCA--->CABG x 5 (LIMA->LAD, VG->Diag, VG->OM, VG->PDA->RPL.  Marland Kitchen HFrEF (heart failure with reduced ejection fraction) ([place])    a. 07/29/2017 Echo: EF 45%; b. 08/06/2017 TEE: EF 30-35%.  . Ischemic cardiomyopathy    a. 07/29/2017 Echo: EF 45%; b. 08/06/2017 TEE: EF 30-35%-->LifeVest placed following CABG.  . LVH (left ventricular hypertrophy)   . Mitral regurgitation   . Respiratory failure, acute ([place])   . 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 Syncope     Tobacco Use: Social History   Tobacco Use  Smoking Status Never Smoker  Smokeless Tobacco Never Used    Labs: Recent Review Flowsheet Data    Labs for ITP Cardiac and Pulmonary Rehab Latest Ref Rng & Units 08/18/2017 08/25/2017   Cholestrol 100 - 199 mg/dL - 113   LDLCALC 0 - 99 mg/dL - 51   HDL >39 mg/dL - 27(L)   Trlycerides 0 - 149 mg/dL - 175(H)   Hemoglobin A1c 4.8 - 5.6 % - 5.8(H)   TCO2 22 - 32 mmol/L 21(L) -       Exercise Target Goals:    Exercise Program Goal: Individual exercise prescription set using results from initial 6 min walk test and THRR while considering  patient's activity barriers and safety.   Exercise Prescription Goal: Initial exercise prescription  builds to 30-45 minutes a day of aerobic activity, 2-3 days per week.  Home exercise guidelines will be given to patient during program as part of exercise prescription that the participant will acknowledge.  Activity Barriers & Risk Stratification: Activity Barriers & Cardiac Risk Stratification - 08/21/17 1327      Activity Barriers & Cardiac Risk Stratification   Activity Barriers  Other (comment);Deconditioning;Muscular Weakness    Comments  Sternal precautions    Cardiac Risk Stratification  High       6 Minute Walk: 6 Minute Walk    Row Name 08/21/17 1444         6 Minute Walk   Phase  Initial     Distance  1060 feet     Walk Time  6 minutes      # of Rest Breaks  0     MPH  2     METS  4.5     RPE  12     VO2 Peak  15.73     Symptoms  No     Resting HR  87 bpm     Resting BP  156/80 worried about LifeVest firing during test     Resting Oxygen Saturation   99 %     Exercise Oxygen Saturation  during 6 min walk  100 %     Max Ex. HR  120 bpm     Max Ex. BP  146/64     2 Minute Post BP  134/66        Oxygen Initial Assessment:   Oxygen Re-Evaluation:   Oxygen Discharge (Final Oxygen Re-Evaluation):   Initial Exercise Prescription: Initial Exercise Prescription - 08/21/17 1400      Date of Initial Exercise RX and Referring Provider   Date  08/21/17    Referring Provider  Daneen Schick MD      Treadmill   MPH  2    Grade  1    Minutes  15    METs  2.81      REL-XR   Level  3    Speed  50    Minutes  15    METs  2.5      T5 Nustep   Level  3    SPM  80    Minutes  15    METs  2.5      Prescription Details   Frequency (times per week)  3    Duration  Progress to 45 minutes of aerobic exercise without signs/symptoms of physical distress      Intensity   THRR 40-80% of Max Heartrate  115-159    Ratings of Perceived Exertion  11-13    Perceived Dyspnea  0-4      Progression   Progression  Continue to progress workloads to maintain intensity without signs/symptoms of physical distress.      Resistance Training   Training Prescription  Yes    Weight  2 lbs    Reps  10-15       Perform Capillary Blood Glucose checks as needed.  Exercise Prescription Changes: Exercise Prescription Changes    Row Name 08/21/17 1300 08/23/17 1500 09/01/17 0800 09/05/17 1400       Response to Exercise   Blood Pressure (Admit)  156/80  128/64  -  120/64    Blood Pressure (Exercise)  142/64  136/80  -  126/82    Blood Pressure (Exit)  134/66  118/74  -  102/70    Heart Rate (Admit)  87 bpm  118 bpm  -  95 bpm    Heart Rate (Exercise)  120 bpm  122 bpm  -  111 bpm    Heart Rate (Exit)  97 bpm  101 bpm  -  90 bpm     Oxygen Saturation (Admit)  99 %  -  -  -    Oxygen Saturation (Exercise)  100 %  -  -  -    Rating of Perceived Exertion (Exercise)  12  13  -  12    Perceived Dyspnea (Exercise)  -  -  -  -    Symptoms  none  none  -  none LifeVest continues to alarm despite no rhythm issues    Comments  walk test results  first full day of exercise  -  -    Duration  -  Progress to 45 minutes of aerobic exercise without signs/symptoms of physical distress  -  Continue with 45 min of aerobic exercise without signs/symptoms of physical distress.    Intensity  -  THRR unchanged  -  THRR unchanged      Progression   Progression  -  Continue to progress workloads to maintain intensity without signs/symptoms of physical distress.  -  Continue to progress workloads to maintain intensity without signs/symptoms of physical distress.    Average METs  -  2.46  -  3.17      Resistance Training   Training Prescription  -  Yes  -  Yes    Weight  -  2 lbs sternal precautions  -  5 lbs    Reps  -  10-15  -  10-15      Interval Training   Interval Training  -  No  -  No      Treadmill   MPH  -  2  -  3    Grade  -  1  -  1    Minutes  -  15  -  15    METs  -  2.81  -  3.71      REL-XR   Level  -  -  -  4    Minutes  -  -  -  15    METs  -  -  -  3.3      T5 Nustep   Level  -  3  -  5    Minutes  -  15  -  15    METs  -  2.1  -  2.5      Home Exercise Plan   Plans to continue exercise at  -  -  Home (comment) walking, ellipitical, rower, bike  Home (comment) walking, ellipitical, rower, bike    Frequency  -  -  Add 3 additional days to program exercise sessions.  Add 3 additional days to program exercise sessions.    Initial Home Exercises Provided  -  -  09/01/17  09/01/17       Exercise Comments: Exercise Comments    Row Name 08/23/17 0804           Exercise Comments  First full day of exercise!  Patient was oriented to gym and equipment including functions, settings, policies, and procedures.   Patient's individual exercise prescription and treatment plan were reviewed.  All starting workloads were established based on the results of the 6  minute walk test done at initial orientation visit.  The plan for exercise progression was also introduced and progression will be customized based on patient's performance and goals.          Exercise Goals and Review: Exercise Goals    Row Name 08/21/17 1446             Exercise Goals   Increase Physical Activity  Yes       Intervention  Provide advice, education, support and counseling about physical activity/exercise needs.;Develop an individualized exercise prescription for aerobic and resistive training based on initial evaluation findings, risk stratification, comorbidities and participant's personal goals.       Expected Outcomes  Short Term: Attend rehab on a regular basis to increase amount of physical activity.;Long Term: Add in home exercise to make exercise part of routine and to increase amount of physical activity.;Long Term: Exercising regularly at least 3-5 days a week.       Increase Strength and Stamina  Yes       Intervention  Provide advice, education, support and counseling about physical activity/exercise needs.;Develop an individualized exercise prescription for aerobic and resistive training based on initial evaluation findings, risk stratification, comorbidities and participant's personal goals.       Expected Outcomes  Short Term: Increase workloads from initial exercise prescription for resistance, speed, and METs.;Short Term: Perform resistance training exercises routinely during rehab and add in resistance training at home;Long Term: Improve cardiorespiratory fitness, muscular endurance and strength as measured by increased METs and functional capacity (6MWT)       Able to understand and use rate of perceived exertion (RPE) scale  Yes       Intervention  Provide education and explanation on how to use RPE scale        Expected Outcomes  Short Term: Able to use RPE daily in rehab to express subjective intensity level;Long Term:  Able to use RPE to guide intensity level when exercising independently       Knowledge and understanding of Target Heart Rate Range (THRR)  Yes       Intervention  Provide education and explanation of THRR including how the numbers were predicted and where they are located for reference       Expected Outcomes  Short Term: Able to state/look up THRR;Long Term: Able to use THRR to govern intensity when exercising independently;Short Term: Able to use daily as guideline for intensity in rehab       Able to check pulse independently  Yes       Intervention  Provide education and demonstration on how to check pulse in carotid and radial arteries.;Review the importance of being able to check your own pulse for safety during independent exercise       Expected Outcomes  Short Term: Able to explain why pulse checking is important during independent exercise;Long Term: Able to check pulse independently and accurately       Understanding of Exercise Prescription  Yes       Intervention  Provide education, explanation, and written materials on patient's individual exercise prescription       Expected Outcomes  Short Term: Able to explain program exercise prescription;Long Term: Able to explain home exercise prescription to exercise independently          Exercise Goals Re-Evaluation : Exercise Goals Re-Evaluation    Row Name 08/23/17 0805 09/01/17 0838 09/05/17 1442         Exercise Goal Re-Evaluation   Exercise  Goals Review  Understanding of Exercise Prescription;Able to understand and use Dyspnea scale;Knowledge and understanding of Target Heart Rate Range (THRR);Able to understand and use rate of perceived exertion (RPE) scale  Understanding of Exercise Prescription;Able to understand and use Dyspnea scale;Knowledge and understanding of Target Heart Rate Range (THRR);Able to understand and use  rate of perceived exertion (RPE) scale;Increase Physical Activity;Increase Strength and Stamina;Able to check pulse independently  Increase Physical Activity;Understanding of Exercise Prescription;Increase Strength and Stamina     Comments  Reviewed RPE scale, THR and program prescription with pt today.  Pt voiced understanding and was given a copy of goals to take home.   Dhruvan is off to a good start in rehab.  He has already started to feel stronger and has more stamina.   He is already asking about what else he can do at home.  Reviewed home exercise with pt today.  Pt plans to use equipment he has at home for exercise.  Sara has a rowing machine, a bike, and a cross elliptical for use at home.  He also has some weights he can use at home as well.  Reviewed THR, pulse, RPE, sign and symptoms, NTG use, and when to call 911 or MD.  Also discussed weather considerations and indoor options.  Pt voiced understanding.  Yvette is doing well in rehab.  He continues to feel better in rehab and at home.  He has started to add in his home exercise and glad to be moving again. He has moved up to 5 lbs weights this week without a problem.  He wants to be able to pick up his daughter by her first birthday next month.  We will continue to work towards this goal.     Expected Outcomes  Short: Use RPE daily to regulate intensity.  Long: Follow program prescription in THR.  Short: Continue to add in home exercise.  Long: Continue to work to build strength and stamina back.   Short: Continue to build upper body strength  Long: Able to pick up daughter.         Discharge Exercise Prescription (Final Exercise Prescription Changes): Exercise Prescription Changes - 09/05/17 1400      Response to Exercise   Blood Pressure (Admit)  120/64    Blood Pressure (Exercise)  126/82    Blood Pressure (Exit)  102/70    Heart Rate (Admit)  95 bpm    Heart Rate (Exercise)  111 bpm    Heart Rate (Exit)  90 bpm    Rating of  Perceived Exertion (Exercise)  12    Symptoms  none LifeVest continues to alarm despite no rhythm issues    Duration  Continue with 45 min of aerobic exercise without signs/symptoms of physical distress.    Intensity  THRR unchanged      Progression   Progression  Continue to progress workloads to maintain intensity without signs/symptoms of physical distress.    Average METs  3.17      Resistance Training   Training Prescription  Yes    Weight  5 lbs    Reps  10-15      Interval Training   Interval Training  No      Treadmill   MPH  3    Grade  1    Minutes  15    METs  3.71      REL-XR   Level  4    Minutes  15    METs  3.3      T5 Nustep   Level  5    Minutes  15    METs  2.5      Home Exercise Plan   Plans to continue exercise at  Home (comment) walking, ellipitical, rower, bike    Frequency  Add 3 additional days to program exercise sessions.    Initial Home Exercises Provided  09/01/17       Nutrition:  Target Goals: Understanding of nutrition guidelines, daily intake of sodium '1500mg'$ , cholesterol '200mg'$ , calories 30% from fat and 7% or less from saturated fats, daily to have 5 or more servings of fruits and vegetables.  Biometrics: Pre Biometrics - 08/21/17 1447      Pre Biometrics   Height  6' 1.6" (1.869 m)    Weight  219 lb 9.6 oz (99.6 kg)    Waist Circumference  40 inches    Hip Circumference  42 inches    Waist to Hip Ratio  0.95 %    BMI (Calculated)  28.52    Single Leg Stand  30 seconds        Nutrition Therapy Plan and Nutrition Goals: Nutrition Therapy & Goals - 09/04/17 0911      Nutrition Therapy   Diet  DASH/ TLC    Drug/Food Interactions  Statins/Certain Fruits    Protein (specify units)  13oz    Fiber  37 grams    Whole Grain Foods  4 servings    Saturated Fats  17 max. grams    Fruits and Vegetables  6 servings/day    Sodium  2000 grams      Personal Nutrition Goals   Nutrition Goal  Be a nutrition facts label reader!  Identify foods and beverages high in sodium and fat    Personal Goal #2  Use salt-free flavorings such as citrus, ginger, dry herbs, low-salt dressings, and other salt-free seasonings    Personal Goal #3  Look for ways to reduce sodium when eating out. Manipulate portions, add protein/ vegetables, get dressings and sauces on the side, or swap sides    Comments  Patient and his wife have started to take steps to reduce dietary sodium intake, and have been cooking at home more.      Intervention Plan   Intervention  Nutrition handout(s) given to patient.;Prescribe, educate and counsel regarding individualized specific dietary modifications aiming towards targeted core components such as weight, hypertension, lipid management, diabetes, heart failure and other comorbidities. Following a low sodium diet    Expected Outcomes  Short Term Goal: Understand basic principles of dietary content, such as calories, fat, sodium, cholesterol and nutrients.;Short Term Goal: A plan has been developed with personal nutrition goals set during dietitian appointment.;Long Term Goal: Adherence to prescribed nutrition plan.       Nutrition Assessments: Nutrition Assessments - 08/21/17 1328      MEDFICTS Scores   Pre Score  6       Nutrition Goals Re-Evaluation: Nutrition Goals Re-Evaluation    Ingenio Name 09/01/17 0800             Goals   Nutrition Goal  Meet with nutritionist with wife.         Comment  Mikale is scheudled to meet with nutritionist on Monday with his wife to make sure he is doing what he should be.  He is looking forward to hearing her suggestions.  They have already reduced sodium and trying to eat chicken and salamon mostly.  Expected Outcome  Short: Meet with dietician.  Long: Continue to follow recommendations.           Nutrition Goals Discharge (Final Nutrition Goals Re-Evaluation): Nutrition Goals Re-Evaluation - 09/01/17 0800      Goals   Nutrition Goal  Meet with  nutritionist with wife.      Comment  Daveion is scheudled to meet with nutritionist on Monday with his wife to make sure he is doing what he should be.  He is looking forward to hearing her suggestions.  They have already reduced sodium and trying to eat chicken and salamon mostly.     Expected Outcome  Short: Meet with dietician.  Long: Continue to follow recommendations.        Psychosocial: Target Goals: Acknowledge presence or absence of significant depression and/or stress, maximize coping skills, provide positive support system. Participant is able to verbalize types and ability to use techniques and skills needed for reducing stress and depression.   Initial Review & Psychosocial Screening: Initial Psych Review & Screening - 08/21/17 1331      Initial Review   Current issues with  Current Stress Concerns    Source of Stress Concerns  Unable to participate in former interests or hobbies;Unable to perform yard/household activities;Chronic Illness;Occupation    Comments  James is fearful of his LifeVest firing inappropriately.  He has had it alarm on multiple occasions when nothing was really going on.  He has had it refitted multiple times.   It is frustrating to him to deal with.  Currently he is unable to drive and is relying on his wife for transportation, who has also been out of work sicne his event.  They also have a 42 month old daughter, Judson Roch, that he wishes he could pick up and hold.  She is heavy handed and they were concerned about her hitting Daddy in the chest.  He really wants to be able to help care for his baby girl.  They take her to [facility] to stay with Mrs. Slocumb mother during appointments.  They are planning to try to bring her to rehab to drop him off in the morning to see how that works for their schedule.  He is also worried about returning to work when it is time as he was keeping crazy hours working here and with his bosses in [place].  He was surviving on limited  sleep (2-4 hrs) each night and working in a stressful job trying to prove himself since he was just rehired just before his daughter was born.  Previously, the company had laid him off for 6 months.  He is excited about starting the program and rebuidling his confidence and strength.       Family Dynamics   Good Support System?  Yes wife, family friends  pastor    Comments  Wife and family are very supportive. She flew out to [facility] to stay with him for a month during his hospitalization and recovery.        Barriers   Psychosocial barriers to participate in program  There are no identifiable barriers or psychosocial needs.;The patient should benefit from training in stress management and relaxation.      Screening Interventions   Interventions  Encouraged to exercise;To provide support and resources with identified psychosocial needs;Provide feedback about the scores to participant;Program counselor consult    Expected Outcomes  Short Term goal: Utilizing psychosocial counselor, staff and physician to assist with identification of specific Stressors or  current issues interfering with healing process. Setting desired goal for each stressor or current issue identified.;Long Term Goal: Stressors or current issues are controlled or eliminated.;Short Term goal: Identification and review with participant of any Quality of Life or Depression concerns found by scoring the questionnaire.;Long Term goal: The participant improves quality of Life and PHQ9 Scores as seen by post scores and/or verbalization of changes       Quality of Life Scores:  Quality of Life - 08/21/17 1333      Quality of Life Scores   Health/Function Pre  24.86 %    Socioeconomic Pre  29.14 %    Psych/Spiritual Pre  30 %    Family Pre  30 %    GLOBAL Pre  27.64 %      Scores of 19 and below usually indicate a poorer quality of life in these areas.  A difference of  2-3 points is a clinically meaningful difference.  A difference  of 2-3 points in the total score of the Quality of Life Index has been associated with significant improvement in overall quality of life, self-image, physical symptoms, and general health in studies assessing change in quality of life.  PHQ-9: Recent Review Flowsheet Data    Depression screen [facility] 2/9 08/21/2017   Decreased Interest 0   Down, Depressed, Hopeless 0   PHQ - 2 Score 0   Altered sleeping 0   Tired, decreased energy 1   Change in appetite 1   Feeling bad or failure about yourself  0   Trouble concentrating 0   Moving slowly or fidgety/restless 0   Suicidal thoughts 0   PHQ-9 Score 2   Difficult doing work/chores Not difficult at all     Interpretation of Total Score  Total Score Depression Severity:  1-4 = Minimal depression, 5-9 = Mild depression, 10-14 = Moderate depression, 15-19 = Moderately severe depression, 20-27 = Severe depression   Psychosocial Evaluation and Intervention: Psychosocial Evaluation - 08/23/17 0959      Psychosocial Evaluation & Interventions   Interventions  Encouraged to exercise with the program and follow exercise prescription;Stress management education;Relaxation education    Comments  Counselor met with Mr. Hollenbeck [place]) and his spouse today for initial psychosocial evaluation.  He is a 42 year old who had (2) heart attacks on 1/8 while on a business trip to [place].  He had a CABGx5 as a result.  Abhay has a strong support system with a spouse of 7 years; family and friends locally and active involvement in their local church.  Christ is healthy - other than his heart and some seasonal allergies.  He is sleeping "better" since the surgery with approximatelly 6 hours/night.  Davari reports he was sleeping approximately 10 hours/week prior to his surgery and realizes how detrimental this was to his health.  Alfio is getting his appetite back as well and is hoping to maintain his healthier weight and eating habits.  He denies a history of  depression or anxiety or any current symptoms.  His mood is typically positive - although he feels tired much of the time.  Current stress in his life includes the "Life Vest" he has to wear that goes off frequently; finances since his spouse has taken time off without pay; his health and they have a new baby that is less than a year old.  Mauri is unable to drive currently or hold his baby so these are some restrictions he hopes change soon.  He has goals to increase his stamina and strength; exercise consistently and be in "better health" than he was prior to the surgery.  Gael and his spouse have exercise equipment at home they are planning to use more consistently once he completes this program.  Staff will follow with Johnavon throughout the course of this program.      Expected Outcomes  Dacota will benefit from consistent exercise to achieve his stated goals.  The educational and psychoeducational components of this program will be helpful in understanding and coping more positively with his condition.      Continue Psychosocial Services   Follow up required by staff       Psychosocial Re-Evaluation: Psychosocial Re-Evaluation    [place] Name 08/30/17 1018 09/06/17 0843           Psychosocial Re-Evaluation   Current issues with  Current Stress Concerns  Current Stress Concerns      Comments  Counselor follow up with Jacki Cones stating he is enjoying this class and is feeling stronger already.  He continues to struggle with the life vest firing often, although he appears to be more relaxed about this.  He is reporting less stress currently and better sleep with a recent change in medication.  counselor commended him for his consistency in exercise and commitment to improved health.    Grace no longer has to wear his LifeVest.  They reviewed the records yesterday and found that it had been double counting his rate.  The rep contacted Dr. Tamala Julian and Dr. Caryl Comes to review results.  After discussion  with physicians, Jahmal was released from having to wear the LifeVest.   He is so excited!!  We also talked about the progression of holding his daughter and wearing her now.        Expected Outcomes  Kenechukwu will continue to exercise consistently for his health and mental health.  He will continue to take his medications which are helping him rest and refresh.  He will continue to use the LifeVest until the Dr. orders otherwise following his next Echo in the next month.  Staff will follow.    Short: Enjoy his new freedom.  Long: Continue to work toward being able to carry his daughter      Interventions  Stress management education;Relaxation education  -         Psychosocial Discharge (Final Psychosocial Re-Evaluation): Psychosocial Re-Evaluation - 09/06/17 [phone]      Psychosocial Re-Evaluation   Current issues with  Current Stress Concerns    Comments  Tylee no longer has to wear his LifeVest.  They reviewed the records yesterday and found that it had been double counting his rate.  The rep contacted Dr. Tamala Julian and Dr. Caryl Comes to review results.  After discussion with physicians, Revan was released from having to wear the LifeVest.   He is so excited!!  We also talked about the progression of holding his daughter and wearing her now.      Expected Outcomes  Short: Enjoy his new freedom.  Long: Continue to work toward being able to carry his daughter       Vocational Rehabilitation: Provide vocational rehab assistance to qualifying candidates.   Vocational Rehab Evaluation & Intervention: Vocational Rehab - 08/21/17 1335      Initial Vocational Rehab Evaluation & Intervention   Assessment shows need for Vocational Rehabilitation  No       Education: Education Goals: Education classes will be provided on a variety of  topics geared toward better understanding of heart health and risk factor modification. Participant will state understanding/return demonstration of topics presented as  noted by education test scores.  Learning Barriers/Preferences: Learning Barriers/Preferences - 08/21/17 1334      Learning Barriers/Preferences   Learning Barriers  None    Learning Preferences  Computer/Internet;Individual Instruction;Verbal Instruction;Video;Written Material       Education Topics:  AED/CPR: - Group verbal and written instruction with the use of models to demonstrate the basic use of the AED with the basic ABC's of resuscitation.   Cardiac Rehab from 09/06/2017 in [facility] Cardiac and Pulmonary Rehab  Date  08/28/17  Educator  [facility]  Instruction Review Code  1- Verbalizes Understanding      General Nutrition Guidelines/Fats and Fiber: -Group instruction provided by verbal, written material, models and posters to present the general guidelines for heart healthy nutrition. Gives an explanation and review of dietary fats and fiber.   Controlling Sodium/Reading Food Labels: -Group verbal and written material supporting the discussion of sodium use in heart healthy nutrition. Review and explanation with models, verbal and written materials for utilization of the food label.   Exercise Physiology & General Exercise Guidelines: - Group verbal and written instruction with models to review the exercise physiology of the cardiovascular system and associated critical values. Provides general exercise guidelines with specific guidelines to those with heart or lung disease.    Cardiac Rehab from 09/06/2017 in [facility] Cardiac and Pulmonary Rehab  Date  09/04/17  Educator  [facility]  Instruction Review Code  1- Verbalizes Understanding      Aerobic Exercise & Resistance Training: - Gives group verbal and written instruction on the various components of exercise. Focuses on aerobic and resistive training programs and the benefits of this training and how to safely progress through these programs..   Flexibility, Balance, Mind/Body Relaxation: Provides group verbal/written instruction on  the benefits of flexibility and balance training, including mind/body exercise modes such as yoga, pilates and tai chi.  Demonstration and skill practice provided.   Stress and Anxiety: - Provides group verbal and written instruction about the health risks of elevated stress and causes of high stress.  Discuss the correlation between heart/lung disease and anxiety and treatment options. Review healthy ways to manage with stress and anxiety.   Depression: - Provides group verbal and written instruction on the correlation between heart/lung disease and depressed mood, treatment options, and the stigmas associated with seeking treatment.   Cardiac Rehab from 09/06/2017 in [facility] Cardiac and Pulmonary Rehab  Date  09/06/17  Educator  [facility]  Instruction Review Code  1- Verbalizes Understanding      Anatomy & Physiology of the Heart: - Group verbal and written instruction and models provide basic cardiac anatomy and physiology, with the coronary electrical and arterial systems. Review of Valvular disease and Heart Failure   Cardiac Procedures: - Group verbal and written instruction to review commonly prescribed medications for heart disease. Reviews the medication, class of the drug, and side effects. Includes the steps to properly store meds and maintain the prescription regimen. (beta blockers and nitrates)   Cardiac Medications I: - Group verbal and written instruction to review commonly prescribed medications for heart disease. Reviews the medication, class of the drug, and side effects. Includes the steps to properly store meds and maintain the prescription regimen.   Cardiac Medications II: -Group verbal and written instruction to review commonly prescribed medications for heart disease. Reviews the medication, class of the drug, and side  effects. (all other drug classes)    Go Sex-Intimacy & Heart Disease, Get SMART - Goal Setting: - Group verbal and written instruction through game  format to discuss heart disease and the return to sexual intimacy. Provides group verbal and written material to discuss and apply goal setting through the application of the S.M.A.R.T. Method.   Other Matters of the Heart: - Provides group verbal, written materials and models to describe Stable Angina and Peripheral Artery. Includes description of the disease process and treatment options available to the cardiac patient.   Exercise & Equipment Safety: - Individual verbal instruction and demonstration of equipment use and safety with use of the equipment.   Cardiac Rehab from 09/06/2017 in [facility] Cardiac and Pulmonary Rehab  Date  08/21/17  Educator  [facility]  Instruction Review Code  1- Verbalizes Understanding      Infection Prevention: - Provides verbal and written material to individual with discussion of infection control including proper hand washing and proper equipment cleaning during exercise session.   Cardiac Rehab from 09/06/2017 in [facility] Cardiac and Pulmonary Rehab  Date  08/21/17  Educator  [facility]  Instruction Review Code  1- Verbalizes Understanding      Falls Prevention: - Provides verbal and written material to individual with discussion of falls prevention and safety.   Cardiac Rehab from 09/06/2017 in [facility] Cardiac and Pulmonary Rehab  Date  08/21/17  Educator  [facility]  Instruction Review Code  1- Verbalizes Understanding      Diabetes: - Individual verbal and written instruction to review signs/symptoms of diabetes, desired ranges of glucose level fasting, after meals and with exercise. Acknowledge that pre and post exercise glucose checks will be done for 3 sessions at entry of program.   Know Your Numbers and Risk Factors: -Group verbal and written instruction about important numbers in your health.  Discussion of what are risk factors and how they play a role in the disease process.  Review of Cholesterol, Blood Pressure, Diabetes, and BMI and the role they play in your  overall health.   Sleep Hygiene: -Provides group verbal and written instruction about how sleep can affect your health.  Define sleep hygiene, discuss sleep cycles and impact of sleep habits. Review good sleep hygiene tips.    Cardiac Rehab from 09/06/2017 in [facility] Cardiac and Pulmonary Rehab  Date  08/23/17  Educator  [facility]  Instruction Review Code  1- Verbalizes Understanding      Other: -Provides group and verbal instruction on various topics (see comments)   Knowledge Questionnaire Score: Knowledge Questionnaire Score - 08/21/17 1334      Knowledge Questionnaire Score   Pre Score  22/28 Reviewed correct responses with Jacki Cones. He verbalized understanding of the responses and had no further questions today.       Core Components/Risk Factors/Patient Goals at Admission: Personal Goals and Risk Factors at Admission - 08/21/17 1329      Core Components/Risk Factors/Patient Goals on Admission    Weight Management  Yes;Weight Maintenance    Intervention  Weight Management: Develop a combined nutrition and exercise program designed to reach desired caloric intake, while maintaining appropriate intake of nutrient and fiber, sodium and fats, and appropriate energy expenditure required for the weight goal.;Weight Management: Provide education and appropriate resources to help participant work on and attain dietary goals. Loss about 20 pounds while in hospital and at discharge.  210 is goal.     Admit Weight  219 lb 9.6 oz (99.6 kg)  Goal Weight: Short Term  217 lb (98.4 kg)    Goal Weight: Long Term  210 lb (95.3 kg)    Expected Outcomes  Short Term: Continue to assess and modify interventions until short term weight is achieved;Long Term: Adherence to nutrition and physical activity/exercise program aimed toward attainment of established weight goal    Heart Failure  Yes Wears a Life Vest.  Next echo in a few months    Intervention  Provide a combined exercise and nutrition program that is  supplemented with education, support and counseling about heart failure. Directed toward relieving symptoms such as shortness of breath, decreased exercise tolerance, and extremity edema.    Expected Outcomes  Improve functional capacity of life;Short term: Attendance in program 2-3 days a week with increased exercise capacity. Reported lower sodium intake. Reported increased fruit and vegetable intake. Reports medication compliance.;Short term: Daily weights obtained and reported for increase. Utilizing diuretic protocols set by physician.;Long term: Adoption of self-care skills and reduction of barriers for early signs and symptoms recognition and intervention leading to self-care maintenance.    Lipids  Yes    Intervention  Provide education and support for participant on nutrition & aerobic/resistive exercise along with prescribed medications to achieve LDL '70mg'$ , HDL >'40mg'$ .    Expected Outcomes  Short Term: Participant states understanding of desired cholesterol values and is compliant with medications prescribed. Participant is following exercise prescription and nutrition guidelines.;Long Term: Cholesterol controlled with medications as prescribed, with individualized exercise RX and with personalized nutrition plan. Value goals: LDL < '70mg'$ , HDL > 40 mg.       Core Components/Risk Factors/Patient Goals Review:  Goals and Risk Factor Review    Row Name 09/01/17 0755             Core Components/Risk Factors/Patient Goals Review   Personal Goals Review  Weight Management/Obesity;Heart Failure;Lipids       Review  Kessler is off to a good start in rehab.  He is already starting to lose weight.  When he is at home he is down to 213 lbs and dressed at 221 lbs.  His goal was to get down to 210 lbs so he is well on his way.  He is sticking with his diet and exercising regular.    So now he wants to work on getting his muscle mass back.  He has not had any heart failure sysmptoms and has been  weighing daily.  His legs have stopped tingling overall.    His circulation is improving and he is not as cold.  He is learning to appreciate the step and make smalll celebrations.   Meds seem to be working for him.        Expected Outcomes  Short: Continue to work on weight loss and buidling muscle mass.  Long: Continue to work to improve heart function.           Core Components/Risk Factors/Patient Goals at Discharge (Final Review):  Goals and Risk Factor Review - 09/01/17 0755      Core Components/Risk Factors/Patient Goals Review   Personal Goals Review  Weight Management/Obesity;Heart Failure;Lipids    Review  Shivank is off to a good start in rehab.  He is already starting to lose weight.  When he is at home he is down to 213 lbs and dressed at 221 lbs.  His goal was to get down to 210 lbs so he is well on his way.  He is sticking with his diet and  exercising regular.    So now he wants to work on getting his muscle mass back.  He has not had any heart failure sysmptoms and has been weighing daily.  His legs have stopped tingling overall.    His circulation is improving and he is not as cold.  He is learning to appreciate the step and make smalll celebrations.   Meds seem to be working for him.     Expected Outcomes  Short: Continue to work on weight loss and buidling muscle mass.  Long: Continue to work to improve heart function.        ITP Comments: ITP Comments    Row Name 08/21/17 1321 08/28/17 0805 09/06/17 0841 09/13/17 0650     ITP Comments  Medical review completed  ITP sent to Dr Sabra Heck for review , changes as needed and signature.  Documentation can be found in [employer] Office Note from [employer] 0/28/2019  Called company about [place], they were not concerned.  He is going to try to call the local rep this week to see if someone could come look at it again.   Gurshaan no longer has to wear his LifeVest.  They reviewed the records yesterday and found that it had been  double counting his rate.  The rep contacted Dr. Tamala Julian and Dr. Caryl Comes to review results.  After discussion with physicians, Jermarcus was released from having to wear the LifeVest.   30 day review completed. Continue with ITP unless diercted changes per Medical Director.  OUt this past week due to family members with the flu       Comments:

## 2017-09-15 ENCOUNTER — Encounter: Payer: Managed Care, Other (non HMO) | Attending: Interventional Cardiology | Admitting: *Deleted

## 2017-09-15 DIAGNOSIS — I2510 Atherosclerotic heart disease of native coronary artery without angina pectoris: Secondary | ICD-10-CM | POA: Insufficient documentation

## 2017-09-15 DIAGNOSIS — Z7951 Long term (current) use of inhaled steroids: Secondary | ICD-10-CM | POA: Insufficient documentation

## 2017-09-15 DIAGNOSIS — Z951 Presence of aortocoronary bypass graft: Secondary | ICD-10-CM | POA: Diagnosis not present

## 2017-09-15 DIAGNOSIS — Z7982 Long term (current) use of aspirin: Secondary | ICD-10-CM | POA: Diagnosis not present

## 2017-09-15 DIAGNOSIS — I213 ST elevation (STEMI) myocardial infarction of unspecified site: Secondary | ICD-10-CM | POA: Insufficient documentation

## 2017-09-15 DIAGNOSIS — Z79899 Other long term (current) drug therapy: Secondary | ICD-10-CM | POA: Diagnosis not present

## 2017-09-15 NOTE — Progress Notes (Signed)
Daily Session Note  Patient Details  Name: Brad Lee MRN: [id number] Date of Birth: Nov 29, 1975 Referring Provider:     Cardiac Rehab from 08/21/2017 in [facility] Cardiac and Pulmonary Rehab  Referring Provider  Daneen Schick MD      Encounter Date: 09/15/2017  Check In: Session Check In - 09/15/17 0759      Check-In   Location  [facility]-Cardiac & Pulmonary Rehab    Staff Present  Alberteen Sam, MA, RCEP, CCRP, Exercise Physiologist;Amanda Oletta Darter, BA, ACSM CEP, Exercise Physiologist;Meredith Sherryll Burger, RN BSN    Supervising physician immediately available to respond to emergencies  See telemetry face sheet for immediately available ER MD    Medication changes reported      No    Fall or balance concerns reported     No    Warm-up and Cool-down  Performed on first and last piece of equipment    Resistance Training Performed  Yes    VAD Patient?  No      Pain Assessment   Currently in Pain?  No/denies          Social History   Tobacco Use  Smoking Status Never Smoker  Smokeless Tobacco Never Used    Goals Met:  Independence with exercise equipment Exercise tolerated well No report of cardiac concerns or symptoms Strength training completed today  Goals Unmet:  Not Applicable  Comments: Pt able to follow exercise prescription today without complaint.  Will continue to monitor for progression.    Dr. Emily Filbert is Medical Director for [place] and [facility].

## 2017-09-17 NOTE — Progress Notes (Signed)
Cardiology Office Note    Date:  09/18/2017   ID:  Brad Lee, DOB [birth year], MRN [id number]  PCP:  Henrine Screws, MD  Cardiologist: Lesleigh Noe, MD   Chief Complaint  Patient presents with  . Coronary Artery Disease    History of Present Illness:  Brad Lee is a 42 y.o. male with no prior history of coronary disease who developed acute coronary syndrome July 25, 2017 in [place] .  After suffering an acute infarction, he underwent successful 5 vessel coronary bypass grafting with LIMA to LAD, SVG to diagonal, SVG to OM, sequential SVG to PDA and PLA.  Transesophageal echo several days after surgery demonstrated LVEF 30-35%.  Syncopal episode occurring upon returning to [facility] on 08/21/2017.   Brad Lee is doing better.  Exertional tolerance is improving.  Intensity of workout at cardiac rehab has been progressed.  With exercise heart rates have began to decrease.  Blood pressures have improved and are in the low normal range with systolic values between 101 110 mmHg.  He has not had syncope or angina.  He is able to lie flat without dyspnea.  There is no lower extremity swelling.  All incisions are well-healed.  He denies chills and fever.    Past Medical History:  Diagnosis Date  . CAD (coronary artery disease)    a. 07/2017 s/p Ant STEMI/Cath [facility], [place]): LAD 100->attempt to open LAD failed, high grade dzs in LCX/RCA--->CABG x 5 (LIMA->LAD, VG->Diag, VG->OM, VG->PDA->RPL.  Marland Kitchen HFrEF (heart failure with reduced ejection fraction) (HCC)    a. 07/29/2017 Echo: EF 45%; b. 08/06/2017 TEE: EF 30-35%.  . Ischemic cardiomyopathy    a. 07/29/2017 Echo: EF 45%; b. 08/06/2017 TEE: EF 30-35%-->LifeVest placed following CABG.  . LVH (left ventricular hypertrophy)   . Mitral regurgitation   . Respiratory failure, acute (HCC)   . 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 Syncope     Past Surgical History:  Procedure Laterality Date  . CORONARY ARTERY BYPASS GRAFT  07/28/2017  .  TONSILLECTOMY      Current Medications: Outpatient Medications Prior to Visit  Medication Sig Dispense Refill  . albuterol (PROVENTIL HFA;VENTOLIN HFA) 108 (90 Base) MCG/ACT inhaler Inhale 2 puffs into the lungs every 6 (six) hours as needed for wheezing or shortness of breath.    Marland Kitchen aspirin 81 MG chewable tablet Chew 1 tablet (81 mg total) by mouth daily. 90 tablet 3  . atorvastatin (LIPITOR) 40 MG tablet Take 40 mg by mouth daily.    . calcium carbonate (TUMS - DOSED IN MG ELEMENTAL CALCIUM) 500 MG chewable tablet Chew 1 tablet by mouth daily as needed for indigestion or heartburn.     . cetirizine (ZYRTEC) 10 MG tablet Take 10 mg by mouth daily as needed for allergies.     . famotidine (PEPCID) 10 MG tablet Take 10 mg by mouth daily as needed for heartburn or indigestion.     . fluticasone (FLONASE) 50 MCG/ACT nasal spray Place 1 spray into both nostrils daily as needed for allergies.     . metoprolol succinate (TOPROL-XL) 50 MG 24 hr tablet Take 1 tablet (50 mg total) by mouth daily. Take with or immediately following a meal. 90 tablet 3  . Multiple Vitamin (MULTIVITAMIN) tablet Take 1 tablet by mouth daily.    . sacubitril-valsartan (ENTRESTO) 24-26 MG Take 1 tablet by mouth 2 (two) times daily. 60 tablet 6   No facility-administered medications prior to visit.  Allergies:   Augmentin [amoxicillin-pot clavulanate]   Social History   Socioeconomic History  . Marital status: Married    Spouse name: None  . Number of children: 0  . Years of education: None  . Highest education level: None  Social Needs  . Financial resource strain: None  . Food insecurity - worry: None  . Food insecurity - inability: None  . Transportation needs - medical: None  . Transportation needs - non-medical: None  Occupational History  . Occupation: OPERATIONS MANAGER  Tobacco Use  . Smoking status: Never Smoker  . Smokeless tobacco: Never Used  Substance and Sexual Activity  . Alcohol use: No      Frequency: Never  . Drug use: No  . Sexual activity: Not Currently  Other Topics Concern  . None  Social History Narrative  . None     Family History:  The patient's family history includes Healthy in his father and mother.   ROS:   Please see the history of present illness.    He is working part-time from home.  No issues with palpitations or chest discomfort. All other systems reviewed and are negative.   PHYSICAL EXAM:   VS:  BP 100/66   Pulse 80   Ht 6\' 2"  (1.88 m)   Wt 219 lb (99.3 kg)   BMI 28.12 kg/m    GEN: Well nourished, well developed, in no acute distress  HEENT: normal  Neck: no JVD, carotid bruits, or masses Cardiac: RRR; no murmurs, rubs, or gallops,no edema  Respiratory:  clear to auscultation bilaterally, normal work of breathing GI: soft, nontender, nondistended, + BS MS: no deformity or atrophy  Skin: warm and dry, no rash Neuro:  Alert and Oriented x 3, Strength and sensation are intact Psych: euthymic mood, full affect  Wt Readings from Last 3 Encounters:  09/18/17 219 lb (99.3 kg)  08/25/17 221 lb 6.4 oz (100.4 kg)  08/21/17 219 lb 9.6 oz (99.6 kg)      Studies/Labs Reviewed:   EKG:  EKG not repeated  Recent Labs: 08/18/2017: B Natriuretic Peptide 201.0 08/19/2017: Hemoglobin 11.9; Magnesium 1.9; Platelets 297 08/25/2017: ALT 29 09/04/2017: BUN 11; Creatinine, Ser 1.10; Potassium 4.4; Sodium 140   Lipid Panel    Component Value Date/Time   CHOL 113 08/25/2017 1400   TRIG 175 (H) 08/25/2017 1400   HDL 27 (L) 08/25/2017 1400   CHOLHDL 4.2 08/25/2017 1400   LDLCALC 51 08/25/2017 1400    Additional studies/ records that were reviewed today include:  No new data.  Most recent BUN and creatinine were normal as was potassium of 4.4.    ASSESSMENT:    1. Coronary artery disease involving coronary bypass graft of native heart with angina pectoris (HCC)   2. Chronic systolic heart failure (HCC)   3. Ischemic cardiomyopathy   4. Syncope,  unspecified syncope type   5. Hyperlipidemia with target LDL less than 70      PLAN:  In order of problems listed above:  1. Status post coronary bypass surgery without angina currently. 2. No clinical evidence of volume overload and no gallop heard on auscultation. 3. LVEF 30-35% when last evaluated.  He is currently on heart failure therapy that includes Entresto and metoprolol succinate.  Plan 2D Doppler echocardiogram in 1 month which will be 90 days out from revascularization.  We had to stop wearing the LifeVest because he got frequent warnings of impending discharge due to double counting of T waves and QRS  complex.  I believe his risk of sudden death/malignant arrhythmia is low given revascularization and no documentation of arrhythmia since surgery. 4. No recurrence.  Likely vasovagal in etiology as it occurred in the setting of nausea and vomiting. 5. LDL target less than 70.  Increase physical activity.  Return to work half time.  Get adequate sleep.    Medication Adjustments/Labs and Tests Ordered: Current medicines are reviewed at length with the patient today.  Concerns regarding medicines are outlined above.  Medication changes, Labs and Tests ordered today are listed in the Patient Instructions below. Patient Instructions  Medication Instructions:  Your physician recommends that you continue on your current medications as directed. Please refer to the Current Medication list given to you today.  Labwork: None  Testing/Procedures: Your physician has requested that you have an echocardiogram end of March or beginning of April. Echocardiography is a painless test that uses sound waves to create images of your heart. It provides your doctor with information about the size and shape of your heart and how well your heart's chambers and valves are working. This procedure takes approximately one hour. There are no restrictions for this procedure.    Follow-Up: Your physician  recommends that you schedule a follow-up appointment in: 6-8 weeks with Dr. Katrinka BlazingSmith. (Already scheduled)   Any Other Special Instructions Will Be Listed Below (If Applicable).     If you need a refill on your cardiac medications before your next appointment, please call your pharmacy.      Signed, Lesleigh NoeHenry W Leroy Trim III, MD  09/18/2017 12:34 PM    [facility] [address], [place], [place]  [address] Phone: [phone]; Fax: [phone]

## 2017-09-18 ENCOUNTER — Encounter: Payer: Self-pay | Admitting: Interventional Cardiology

## 2017-09-18 ENCOUNTER — Ambulatory Visit (INDEPENDENT_AMBULATORY_CARE_PROVIDER_SITE_OTHER): Payer: Managed Care, Other (non HMO) | Admitting: Interventional Cardiology

## 2017-09-18 ENCOUNTER — Encounter: Payer: Managed Care, Other (non HMO) | Admitting: *Deleted

## 2017-09-18 VITALS — BP 100/66 | HR 80 | Ht 74.0 in | Wt 219.0 lb

## 2017-09-18 DIAGNOSIS — I25709 Atherosclerosis of coronary artery bypass graft(s), unspecified, with unspecified angina pectoris: Secondary | ICD-10-CM

## 2017-09-18 DIAGNOSIS — I213 ST elevation (STEMI) myocardial infarction of unspecified site: Secondary | ICD-10-CM | POA: Diagnosis not present

## 2017-09-18 DIAGNOSIS — I255 Ischemic cardiomyopathy: Secondary | ICD-10-CM

## 2017-09-18 DIAGNOSIS — E785 Hyperlipidemia, unspecified: Secondary | ICD-10-CM

## 2017-09-18 DIAGNOSIS — R55 Syncope and collapse: Secondary | ICD-10-CM

## 2017-09-18 DIAGNOSIS — I5022 Chronic systolic (congestive) heart failure: Secondary | ICD-10-CM | POA: Diagnosis not present

## 2017-09-18 NOTE — Progress Notes (Signed)
Daily Session Note  Patient Details  Name: Brad Lee MRN: [id number] Date of Birth: [birth year] Referring Provider:     Cardiac Rehab from 08/21/2017 in [facility] Cardiac and Pulmonary Rehab  Referring Provider  Daneen Schick MD      Encounter Date: 09/18/2017  Check In: Session Check In - 09/18/17 0758      Check-In   Location  [facility]-Cardiac & Pulmonary Rehab    Staff Present  Earlean Shawl, BS, ACSM CEP, Exercise Physiologist;Jessica Luan Pulling, MA, RCEP, CCRP, Exercise Physiologist;Susanne Bice, RN, BSN, CCRP    Supervising physician immediately available to respond to emergencies  See telemetry face sheet for immediately available ER MD    Medication changes reported      No    Fall or balance concerns reported     No    Warm-up and Cool-down  Performed on first and last piece of equipment    Resistance Training Performed  Yes    VAD Patient?  No      Pain Assessment   Currently in Pain?  No/denies    Multiple Pain Sites  No          Social History   Tobacco Use  Smoking Status Never Smoker  Smokeless Tobacco Never Used    Goals Met:  Independence with exercise equipment Exercise tolerated well No report of cardiac concerns or symptoms Strength training completed today  Goals Unmet:  Not Applicable  Comments: Pt able to follow exercise prescription today without complaint.  Will continue to monitor for progression.    Dr. Emily Filbert is Medical Director for [place] and [facility].

## 2017-09-18 NOTE — Patient Instructions (Addendum)
Medication Instructions:  Your physician recommends that you continue on your current medications as directed. Please refer to the Current Medication list given to you today.  Labwork: None  Testing/Procedures: Your physician has requested that you have an echocardiogram end of March or beginning of April. Echocardiography is a painless test that uses sound waves to create images of your heart. It provides your doctor with information about the size and shape of your heart and how well your heart's chambers and valves are working. This procedure takes approximately one hour. There are no restrictions for this procedure.    Follow-Up: Your physician recommends that you schedule a follow-up appointment in: 6-8 weeks with Dr. Katrinka BlazingSmith. (Already scheduled)   Any Other Special Instructions Will Be Listed Below (If Applicable).     If you need a refill on your cardiac medications before your next appointment, please call your pharmacy.

## 2017-09-20 DIAGNOSIS — I213 ST elevation (STEMI) myocardial infarction of unspecified site: Secondary | ICD-10-CM | POA: Diagnosis not present

## 2017-09-20 NOTE — Progress Notes (Signed)
Daily Session Note  Patient Details  Name: Brad Lee MRN: [id number] Date of Birth: 01-21-1976 Referring Provider:     Cardiac Rehab from 08/21/2017 in [facility] Cardiac and Pulmonary Rehab  Referring Provider  Daneen Schick MD      Encounter Date: 09/20/2017  Check In: Session Check In - 09/20/17 0754      Check-In   Location  [facility]-Cardiac & Pulmonary Rehab    Staff Present  Alberteen Sam, MA, RCEP, CCRP, Exercise Physiologist;Susanne Bice, RN, BSN, CCRP;Maxx Pham Flavia Shipper    Supervising physician immediately available to respond to emergencies  See telemetry face sheet for immediately available ER MD    Medication changes reported      No    Fall or balance concerns reported     No    Warm-up and Cool-down  Performed on first and last piece of equipment    Resistance Training Performed  Yes    VAD Patient?  No      Pain Assessment   Currently in Pain?  No/denies          Social History   Tobacco Use  Smoking Status Never Smoker  Smokeless Tobacco Never Used    Goals Met:  Independence with exercise equipment Exercise tolerated well No report of cardiac concerns or symptoms Strength training completed today  Goals Unmet:  Not Applicable  Comments: Pt able to follow exercise prescription today without complaint.  Will continue to monitor for progression.   Dr. Emily Filbert is Medical Director for [place] and [facility].

## 2017-09-22 ENCOUNTER — Encounter: Payer: Managed Care, Other (non HMO) | Admitting: *Deleted

## 2017-09-22 DIAGNOSIS — I213 ST elevation (STEMI) myocardial infarction of unspecified site: Secondary | ICD-10-CM | POA: Diagnosis not present

## 2017-09-22 NOTE — Progress Notes (Signed)
Daily Session Note  Patient Details  Name: Brad Lee MRN: [id number] Date of Birth: 07/16/1976 Referring Provider:     Cardiac Rehab from 08/21/2017 in [facility] Cardiac and Pulmonary Rehab  Referring Provider  Daneen Schick MD      Encounter Date: 09/22/2017  Check In: Session Check In - 09/22/17 0817      Check-In   Location  [facility]-Cardiac & Pulmonary Rehab    Staff Present  Alberteen Sam, MA, RCEP, CCRP, Exercise Physiologist;Meredith Sherryll Burger, RN Vickki Hearing, BA, ACSM CEP, Exercise Physiologist    Supervising physician immediately available to respond to emergencies  See telemetry face sheet for immediately available ER MD    Medication changes reported      No    Fall or balance concerns reported     No    Warm-up and Cool-down  Performed on first and last piece of equipment    Resistance Training Performed  Yes    VAD Patient?  No      Pain Assessment   Currently in Pain?  No/denies          Social History   Tobacco Use  Smoking Status Never Smoker  Smokeless Tobacco Never Used    Goals Met:  Independence with exercise equipment Using PLB without cueing & demonstrates good technique Exercise tolerated well Personal goals reviewed No report of cardiac concerns or symptoms Strength training completed today  Goals Unmet:  Not Applicable  Comments: Pt able to follow exercise prescription today without complaint.  Will continue to monitor for progression.    Dr. Emily Filbert is Medical Director for [place] and [facility].

## 2017-09-25 ENCOUNTER — Encounter: Payer: Managed Care, Other (non HMO) | Admitting: *Deleted

## 2017-09-25 DIAGNOSIS — I213 ST elevation (STEMI) myocardial infarction of unspecified site: Secondary | ICD-10-CM

## 2017-09-25 NOTE — Progress Notes (Signed)
Daily Session Note  Patient Details  Name: Brad Lee MRN: [id number] Date of Birth: September 14, 1975 Referring Provider:     Cardiac Rehab from 08/21/2017 in [facility] Cardiac and Pulmonary Rehab  Referring Provider  Brad Schick MD      Encounter Date: 09/25/2017  Check In: Session Check In - 09/25/17 0754      Check-In   Location  [facility]-Cardiac & Pulmonary Rehab    Staff Present  Alberteen Sam, MA, RCEP, CCRP, Exercise Physiologist;Kelly Amedeo Plenty, BS, ACSM CEP, Exercise Physiologist;Susanne Bice, RN, BSN, CCRP    Supervising physician immediately available to respond to emergencies  See telemetry face sheet for immediately available ER MD    Medication changes reported      No    Fall or balance concerns reported     No    Warm-up and Cool-down  Performed on first and last piece of equipment    Resistance Training Performed  Yes    VAD Patient?  No      Pain Assessment   Currently in Pain?  No/denies          Social History   Tobacco Use  Smoking Status Never Smoker  Smokeless Tobacco Never Used    Goals Met:  Independence with exercise equipment Exercise tolerated well No report of cardiac concerns or symptoms Strength training completed today  Goals Unmet:  Not Applicable  Comments: Pt able to follow exercise prescription today without complaint.  Will continue to monitor for progression.    Dr. Emily Lee is Medical Director for [name] and [facility].

## 2017-09-27 DIAGNOSIS — I213 ST elevation (STEMI) myocardial infarction of unspecified site: Secondary | ICD-10-CM | POA: Diagnosis not present

## 2017-09-27 NOTE — Progress Notes (Signed)
Daily Session Note  Patient Details  Name: Brad Lee MRN: [id number] Date of Birth: 1975/08/24 Referring Provider:     Cardiac Rehab from 08/21/2017 in [facility] Cardiac and Pulmonary Rehab  Referring Provider  Daneen Schick MD      Encounter Date: 09/27/2017  Check In: Session Check In - 09/27/17 0753      Check-In   Location  [facility]-Cardiac & Pulmonary Rehab    Staff Present  Justin Mend RCP,RRT,BSRT;Heath Lark, RN, BSN, CCRP;Jessica Luan Pulling, MA, RCEP, CCRP, Exercise Physiologist    Supervising physician immediately available to respond to emergencies  See telemetry face sheet for immediately available ER MD    Medication changes reported      No    Fall or balance concerns reported     No    Warm-up and Cool-down  Performed on first and last piece of equipment    Resistance Training Performed  Yes    VAD Patient?  No      Pain Assessment   Currently in Pain?  No/denies          Social History   Tobacco Use  Smoking Status Never Smoker  Smokeless Tobacco Never Used    Goals Met:  Independence with exercise equipment Exercise tolerated well No report of cardiac concerns or symptoms Strength training completed today  Goals Unmet:  Not Applicable  Comments: Pt able to follow exercise prescription today without complaint.  Will continue to monitor for progression.   Dr. Emily Filbert is Medical Director for [place] and [facility].

## 2017-09-29 DIAGNOSIS — I213 ST elevation (STEMI) myocardial infarction of unspecified site: Secondary | ICD-10-CM

## 2017-09-29 NOTE — Progress Notes (Signed)
Daily Session Note  Patient Details  Name: Brad Lee MRN: [id number] Date of Birth: 09-25-1975 Referring Provider:     Cardiac Rehab from 08/21/2017 in [facility] Cardiac and Pulmonary Rehab  Referring Provider  Daneen Schick MD      Encounter Date: 09/29/2017  Check In: Session Check In - 09/29/17 0758      Check-In   Location  [facility]-Cardiac & Pulmonary Rehab    Staff Present  Joellyn Rued, BS, PEC;Meredith [place], RN Vickki Hearing, BA, ACSM CEP, Exercise Physiologist    Supervising physician immediately available to respond to emergencies  See telemetry face sheet for immediately available ER MD    Medication changes reported      No    Fall or balance concerns reported     No    Warm-up and Cool-down  Performed on first and last piece of equipment    Resistance Training Performed  Yes    VAD Patient?  No      Pain Assessment   Currently in Pain?  No/denies    Multiple Pain Sites  No          Social History   Tobacco Use  Smoking Status Never Smoker  Smokeless Tobacco Never Used    Goals Met:  Independence with exercise equipment Exercise tolerated well No report of cardiac concerns or symptoms Strength training completed today  Goals Unmet:  Not Applicable  Comments: Pt able to follow exercise prescription today without complaint.  Will continue to monitor for progression.    Dr. Emily Filbert is Medical Director for [name].

## 2017-09-30 ENCOUNTER — Other Ambulatory Visit: Payer: Self-pay | Admitting: Interventional Cardiology

## 2017-10-02 ENCOUNTER — Encounter: Payer: Managed Care, Other (non HMO) | Admitting: *Deleted

## 2017-10-02 DIAGNOSIS — I213 ST elevation (STEMI) myocardial infarction of unspecified site: Secondary | ICD-10-CM | POA: Diagnosis not present

## 2017-10-02 NOTE — Progress Notes (Signed)
Daily Session Note  Patient Details  Name: Brad Lee MRN: [id number] Date of Birth: [birth year] Referring Provider:     Cardiac Rehab from 08/21/2017 in [facility] Cardiac and Pulmonary Rehab  Referring Provider  Daneen Schick MD      Encounter Date: 10/02/2017  Check In: Session Check In - 10/02/17 0746      Check-In   Location  [facility]-Cardiac & Pulmonary Rehab    Staff Present  Alberteen Sam, MA, RCEP, CCRP, Exercise Physiologist;Kelly Amedeo Plenty, BS, ACSM CEP, Exercise Physiologist;Susanne Bice, RN, BSN, CCRP    Supervising physician immediately available to respond to emergencies  See telemetry face sheet for immediately available ER MD    Medication changes reported      No    Fall or balance concerns reported     No    Warm-up and Cool-down  Performed on first and last piece of equipment    Resistance Training Performed  Yes    VAD Patient?  No      Pain Assessment   Currently in Pain?  No/denies          Social History   Tobacco Use  Smoking Status Never Smoker  Smokeless Tobacco Never Used    Goals Met:  Independence with exercise equipment Exercise tolerated well No report of cardiac concerns or symptoms Strength training completed today  Goals Unmet:  Not Applicable  Comments: Pt able to follow exercise prescription today without complaint.  Will continue to monitor for progression.    Dr. Emily Filbert is Medical Director for [place] and [facility].

## 2017-10-04 DIAGNOSIS — I213 ST elevation (STEMI) myocardial infarction of unspecified site: Secondary | ICD-10-CM | POA: Diagnosis not present

## 2017-10-04 NOTE — Progress Notes (Signed)
Daily Session Note  Patient Details  Name: Brad Lee MRN: [id number] Date of Birth: 1975/07/23 Referring Provider:     Cardiac Rehab from 08/21/2017 in [facility] Cardiac and Pulmonary Rehab  Referring Provider  Daneen Schick MD      Encounter Date: 10/04/2017  Check In: Session Check In - 10/04/17 0801      Check-In   Location  [facility]-Cardiac & Pulmonary Rehab    Staff Present  Alberteen Sam, MA, RCEP, CCRP, Exercise Physiologist;Jaiquan Temme [place];Heath Lark, RN, BSN, CCRP    Supervising physician immediately available to respond to emergencies  See telemetry face sheet for immediately available ER MD    Medication changes reported      No    Fall or balance concerns reported     No    Tobacco Cessation  No Change    Warm-up and Cool-down  Performed on first and last piece of equipment    Resistance Training Performed  Yes    VAD Patient?  No      Pain Assessment   Currently in Pain?  No/denies          Social History   Tobacco Use  Smoking Status Never Smoker  Smokeless Tobacco Never Used    Goals Met:  Independence with exercise equipment Exercise tolerated well No report of cardiac concerns or symptoms Strength training completed today  Goals Unmet:  Not Applicable  Comments: Pt able to follow exercise prescription today without complaint.  Will continue to monitor for progression.   Dr. Emily Filbert is Medical Director for [place] and [facility].

## 2017-10-06 DIAGNOSIS — I213 ST elevation (STEMI) myocardial infarction of unspecified site: Secondary | ICD-10-CM | POA: Diagnosis not present

## 2017-10-06 NOTE — Progress Notes (Signed)
Daily Session Note  Patient Details  Name: Brad Lee MRN: [id number] Date of Birth: [birth year] Referring Provider:     Cardiac Rehab from 08/21/2017 in [facility] Cardiac and Pulmonary Rehab  Referring Provider  Daneen Schick MD      Encounter Date: 10/06/2017  Check In:    Exercise Prescription Changes - 10/05/17 1100      Response to Exercise   Blood Pressure (Admit)  128/62    Blood Pressure (Exercise)  142/76    Blood Pressure (Exit)  102/62    Heart Rate (Admit)  92 bpm    Heart Rate (Exercise)  138 bpm    Heart Rate (Exit)  89 bpm    Rating of Perceived Exertion (Exercise)  13    Symptoms  none    Duration  Continue with 45 min of aerobic exercise without signs/symptoms of physical distress.    Intensity  THRR unchanged      Progression   Progression  Continue to progress workloads to maintain intensity without signs/symptoms of physical distress.    Average METs  6.1      Resistance Training   Training Prescription  Yes    Weight  5 lbs    Reps  10-15      Interval Training   Interval Training  Yes    Equipment  Treadmill;REL-XR;T5 Nustep    Comments  11mn on 143m off      Treadmill   MPH  3    Grade  8    Minutes  15    METs  6.6      REL-XR   Level  15    Minutes  15    METs  7.9      T5 Nustep   Level  10    Minutes  15    METs  4.7      Home Exercise Plan   Plans to continue exercise at  Home (comment) walking, ellipitical, rower, bike    Frequency  Add 3 additional days to program exercise sessions.    Initial Home Exercises Provided  09/01/17       Social History   Tobacco Use  Smoking Status Never Smoker  Smokeless Tobacco Never Used    Goals Met:  Independence with exercise equipment Exercise tolerated well No report of cardiac concerns or symptoms Strength training completed today  Goals Unmet:  Not Applicable  Comments: Pt able to follow exercise prescription today without complaint.  Will continue to monitor for  progression.    Dr. MaEmily Filberts Medical Director for [place].

## 2017-10-09 ENCOUNTER — Encounter: Payer: Managed Care, Other (non HMO) | Admitting: *Deleted

## 2017-10-09 DIAGNOSIS — I213 ST elevation (STEMI) myocardial infarction of unspecified site: Secondary | ICD-10-CM

## 2017-10-09 NOTE — Progress Notes (Signed)
Daily Session Note  Patient Details  Name: Brad Lee MRN: [id number] Date of Birth: Apr 13, 1976 Referring Provider:     Cardiac Rehab from 08/21/2017 in [facility]  Referring Provider  Daneen Schick MD      Encounter Date: 10/09/2017  Check In: Session Check In - 10/09/17 0830      Check-In   Location  [facility]    Staff Present  Alberteen Sam, MA, RCEP, CCRP, Exercise Physiologist;Kelly Amedeo Plenty, BS, ACSM CEP, Exercise Physiologist;Susanne Bice, RN, BSN, CCRP    Supervising physician immediately available to respond to emergencies  See telemetry face sheet for immediately available ER MD    Medication changes reported      No    Fall or balance concerns reported     No    Warm-up and Cool-down  Performed on first and last piece of equipment    Resistance Training Performed  Yes    VAD Patient?  No      Pain Assessment   Currently in Pain?  No/denies          Social History   Tobacco Use  Smoking Status Never Smoker  Smokeless Tobacco Never Used    Goals Met:  Independence with exercise equipment Exercise tolerated well No report of cardiac concerns or symptoms Strength training completed today  Goals Unmet:  Not Applicable  Comments: Pt able to follow exercise prescription today without complaint.  Will continue to monitor for progression.    Dr. Emily Filbert is Medical Director for [facility].

## 2017-10-11 ENCOUNTER — Encounter: Payer: Self-pay | Admitting: *Deleted

## 2017-10-11 VITALS — Ht 73.6 in | Wt 221.6 lb

## 2017-10-11 DIAGNOSIS — I213 ST elevation (STEMI) myocardial infarction of unspecified site: Secondary | ICD-10-CM | POA: Diagnosis not present

## 2017-10-11 NOTE — Progress Notes (Signed)
Cardiac Individual Treatment Plan  Patient Details  Name: Brad Lee MRN: [id number] Date of Birth: 01-18-1976 Referring Provider:     Cardiac Rehab from 08/21/2017 in [facility] Cardiac and Pulmonary Rehab  Referring Provider  Daneen Schick MD      Initial Encounter Date:    Cardiac Rehab from 08/21/2017 in [facility] Cardiac and Pulmonary Rehab  Date  08/21/17  Referring Provider  Daneen Schick MD      Visit Diagnosis: ST elevation myocardial infarction (STEMI), unspecified artery ([place])  Patient's Home Medications on Admission:  Current Outpatient Medications:  .  albuterol (PROVENTIL HFA;VENTOLIN HFA) 108 (90 Base) MCG/ACT inhaler, Inhale 2 puffs into the lungs every 6 (six) hours as needed for wheezing or shortness of breath., Disp: , Rfl:  .  aspirin 81 MG chewable tablet, Chew 1 tablet (81 mg total) by mouth daily., Disp: 90 tablet, Rfl: 3 .  atorvastatin (LIPITOR) 40 MG tablet, TAKE 1 TABLET BY MOUTH EVERY DAY, Disp: 90 tablet, Rfl: 3 .  calcium carbonate (TUMS - DOSED IN MG ELEMENTAL CALCIUM) 500 MG chewable tablet, Chew 1 tablet by mouth daily as needed for indigestion or heartburn. , Disp: , Rfl:  .  cetirizine (ZYRTEC) 10 MG tablet, Take 10 mg by mouth daily as needed for allergies. , Disp: , Rfl:  .  famotidine (PEPCID) 10 MG tablet, Take 10 mg by mouth daily as needed for heartburn or indigestion. , Disp: , Rfl:  .  fluticasone (FLONASE) 50 MCG/ACT nasal spray, Place 1 spray into both nostrils daily as needed for allergies. , Disp: , Rfl:  .  metoprolol succinate (TOPROL-XL) 50 MG 24 hr tablet, Take 1 tablet (50 mg total) by mouth daily. Take with or immediately following a meal., Disp: 90 tablet, Rfl: 3 .  Multiple Vitamin (MULTIVITAMIN) tablet, Take 1 tablet by mouth daily., Disp: , Rfl:  .  sacubitril-valsartan (ENTRESTO) 24-26 MG, Take 1 tablet by mouth 2 (two) times daily., Disp: 60 tablet, Rfl: 6  Past Medical History: Past Medical History:  Diagnosis Date  . CAD (coronary  artery disease)    a. 07/2017 s/p Ant STEMI/Cath [facility] [place]): LAD 100->attempt to open LAD failed, high grade dzs in LCX/RCA--->CABG x 5 (LIMA->LAD, VG->Diag, VG->OM, VG->PDA->RPL.  Marland Kitchen HFrEF (heart failure with reduced ejection fraction) ([place])    a. 07/29/2017 Echo: EF 45%; b. 08/06/2017 TEE: EF 30-35%.  . Ischemic cardiomyopathy    a. 07/29/2017 Echo: EF 45%; b. 08/06/2017 TEE: EF 30-35%-->LifeVest placed following CABG.  . LVH (left ventricular hypertrophy)   . Mitral regurgitation   . Respiratory failure, acute ([place])   . 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 Syncope     Tobacco Use: Social History   Tobacco Use  Smoking Status Never Smoker  Smokeless Tobacco Never Used    Labs: Recent Review Flowsheet Data    Labs for ITP Cardiac and Pulmonary Rehab Latest Ref Rng & Units 08/18/2017 08/25/2017   Cholestrol 100 - 199 mg/dL - 113   LDLCALC 0 - 99 mg/dL - 51   HDL >39 mg/dL - 27(L)   Trlycerides 0 - 149 mg/dL - 175(H)   Hemoglobin A1c 4.8 - 5.6 % - 5.8(H)   TCO2 22 - 32 mmol/L 21(L) -       Exercise Target Goals:    Exercise Program Goal: Individual exercise prescription set using results from initial 6 min walk test and THRR while considering  patient's activity barriers and safety.   Exercise Prescription Goal: Initial  exercise prescription builds to 30-45 minutes a day of aerobic activity, 2-3 days per week.  Home exercise guidelines will be given to patient during program as part of exercise prescription that the participant will acknowledge.  Activity Barriers & Risk Stratification: Activity Barriers & Cardiac Risk Stratification - 08/21/17 1327      Activity Barriers & Cardiac Risk Stratification   Activity Barriers  Other (comment);Deconditioning;Muscular Weakness    Comments  Sternal precautions    Cardiac Risk Stratification  High       6 Minute Walk: 6 Minute Walk    Row Name 08/21/17 1444         6 Minute Walk   Phase  Initial     Distance  1060 feet     Walk  Time  6 minutes     # of Rest Breaks  0     MPH  2     METS  4.5     RPE  12     VO2 Peak  15.73     Symptoms  No     Resting HR  87 bpm     Resting BP  156/80 worried about LifeVest firing during test     Resting Oxygen Saturation   99 %     Exercise Oxygen Saturation  during 6 min walk  100 %     Max Ex. HR  120 bpm     Max Ex. BP  146/64     2 Minute Post BP  134/66        Oxygen Initial Assessment:   Oxygen Re-Evaluation:   Oxygen Discharge (Final Oxygen Re-Evaluation):   Initial Exercise Prescription: Initial Exercise Prescription - 08/21/17 1400      Date of Initial Exercise RX and Referring Provider   Date  08/21/17    Referring Provider  Daneen Schick MD      Treadmill   MPH  2    Grade  1    Minutes  15    METs  2.81      REL-XR   Level  3    Speed  50    Minutes  15    METs  2.5      T5 Nustep   Level  3    SPM  80    Minutes  15    METs  2.5      Prescription Details   Frequency (times per week)  3    Duration  Progress to 45 minutes of aerobic exercise without signs/symptoms of physical distress      Intensity   THRR 40-80% of Max Heartrate  115-159    Ratings of Perceived Exertion  11-13    Perceived Dyspnea  0-4      Progression   Progression  Continue to progress workloads to maintain intensity without signs/symptoms of physical distress.      Resistance Training   Training Prescription  Yes    Weight  2 lbs    Reps  10-15       Perform Capillary Blood Glucose checks as needed.  Exercise Prescription Changes: Exercise Prescription Changes    Row Name 08/21/17 1300 08/23/17 1500 09/01/17 0800 09/05/17 1400 09/20/17 1500     Response to Exercise   Blood Pressure (Admit)  156/80  128/64  -  120/64  132/66   Blood Pressure (Exercise)  142/64  136/80  -  126/82  138/72   Blood Pressure (Exit)  134/66  118/74  -  102/70  110/56   Heart Rate (Admit)  87 bpm  118 bpm  -  95 bpm  58 bpm   Heart Rate (Exercise)  120 bpm  122 bpm  -   111 bpm  138 bpm   Heart Rate (Exit)  97 bpm  101 bpm  -  90 bpm  91 bpm   Oxygen Saturation (Admit)  99 %  -  -  -  -   Oxygen Saturation (Exercise)  100 %  -  -  -  -   Rating of Perceived Exertion (Exercise)  12  13  -  12  13   Perceived Dyspnea (Exercise)  -  -  -  -  -   Symptoms  none  none  -  none LifeVest continues to alarm despite no rhythm issues  none   Comments  walk test results  first full day of exercise  -  -  -   Duration  -  Progress to 45 minutes of aerobic exercise without signs/symptoms of physical distress  -  Continue with 45 min of aerobic exercise without signs/symptoms of physical distress.  Continue with 45 min of aerobic exercise without signs/symptoms of physical distress.   Intensity  -  THRR unchanged  -  THRR unchanged  THRR unchanged     Progression   Progression  -  Continue to progress workloads to maintain intensity without signs/symptoms of physical distress.  -  Continue to progress workloads to maintain intensity without signs/symptoms of physical distress.  Continue to progress workloads to maintain intensity without signs/symptoms of physical distress.   Average METs  -  2.46  -  3.17  7.4     Resistance Training   Training Prescription  -  Yes  -  Yes  Yes   Weight  -  2 lbs sternal precautions  -  5 lbs  5 lbs   Reps  -  10-15  -  10-15  10-15     Interval Training   Interval Training  -  No  -  No  Yes   Equipment  -  -  -  -  Treadmill;Biostep-RELP;REL-XR   Comments  -  -  -  -  51mn off 30sec on     Treadmill   MPH  -  2  -  3  3   Grade  -  1  -  1  8   Minutes  -  15  -  15  15   METs  -  2.81  -  3.71  6.6     REL-XR   Level  -  -  -  4  10 off at level4   Minutes  -  -  -  15  15   METs  -  -  -  3.3  11     T5 Nustep   Level  -  3  -  5  10 off at level 5   Minutes  -  15  -  15  15   METs  -  2.1  -  2.5  4.5     Home Exercise Plan   Plans to continue exercise at  -  -  Home (comment) walking, ellipitical, rower, bike   Home (comment) walking, ellipitical, rower, bike  Home (comment) walking, ellipitical, rower, bike   Frequency  -  -  Add 3 additional  days to program exercise sessions.  Add 3 additional days to program exercise sessions.  Add 3 additional days to program exercise sessions.   Initial Home Exercises Provided  -  -  09/01/17  09/01/17  09/01/17   Row Name 10/05/17 1100             Response to Exercise   Blood Pressure (Admit)  128/62       Blood Pressure (Exercise)  142/76       Blood Pressure (Exit)  102/62       Heart Rate (Admit)  92 bpm       Heart Rate (Exercise)  138 bpm       Heart Rate (Exit)  89 bpm       Rating of Perceived Exertion (Exercise)  13       Symptoms  none       Duration  Continue with 45 min of aerobic exercise without signs/symptoms of physical distress.       Intensity  THRR unchanged         Progression   Progression  Continue to progress workloads to maintain intensity without signs/symptoms of physical distress.       Average METs  6.1         Resistance Training   Training Prescription  Yes       Weight  5 lbs       Reps  10-15         Interval Training   Interval Training  Yes       Equipment  Treadmill;REL-XR;T5 Nustep       Comments  81mn on 159m off         Treadmill   MPH  3       Grade  8       Minutes  15       METs  6.6         REL-XR   Level  15       Minutes  15       METs  7.9         T5 Nustep   Level  10       Minutes  15       METs  4.7         Home Exercise Plan   Plans to continue exercise at  Home (comment) walking, ellipitical, rower, bike       Frequency  Add 3 additional days to program exercise sessions.       Initial Home Exercises Provided  09/01/17          Exercise Comments: Exercise Comments    Row Name 08/23/17 0804           Exercise Comments  First full day of exercise!  Patient was oriented to gym and equipment including functions, settings, policies, and procedures.  Patient's individual  exercise prescription and treatment plan were reviewed.  All starting workloads were established based on the results of the 6 minute walk test done at initial orientation visit.  The plan for exercise progression was also introduced and progression will be customized based on patient's performance and goals.          Exercise Goals and Review: Exercise Goals    Row Name 08/21/17 1446             Exercise Goals   Increase Physical Activity  Yes       Intervention  Provide  advice, education, support and counseling about physical activity/exercise needs.;Develop an individualized exercise prescription for aerobic and resistive training based on initial evaluation findings, risk stratification, comorbidities and participant's personal goals.       Expected Outcomes  Short Term: Attend rehab on a regular basis to increase amount of physical activity.;Long Term: Add in home exercise to make exercise part of routine and to increase amount of physical activity.;Long Term: Exercising regularly at least 3-5 days a week.       Increase Strength and Stamina  Yes       Intervention  Provide advice, education, support and counseling about physical activity/exercise needs.;Develop an individualized exercise prescription for aerobic and resistive training based on initial evaluation findings, risk stratification, comorbidities and participant's personal goals.       Expected Outcomes  Short Term: Increase workloads from initial exercise prescription for resistance, speed, and METs.;Short Term: Perform resistance training exercises routinely during rehab and add in resistance training at home;Long Term: Improve cardiorespiratory fitness, muscular endurance and strength as measured by increased METs and functional capacity (6MWT)       Able to understand and use rate of perceived exertion (RPE) scale  Yes       Intervention  Provide education and explanation on how to use RPE scale       Expected Outcomes  Short  Term: Able to use RPE daily in rehab to express subjective intensity level;Long Term:  Able to use RPE to guide intensity level when exercising independently       Knowledge and understanding of Target Heart Rate Range (THRR)  Yes       Intervention  Provide education and explanation of THRR including how the numbers were predicted and where they are located for reference       Expected Outcomes  Short Term: Able to state/look up THRR;Long Term: Able to use THRR to govern intensity when exercising independently;Short Term: Able to use daily as guideline for intensity in rehab       Able to check pulse independently  Yes       Intervention  Provide education and demonstration on how to check pulse in carotid and radial arteries.;Review the importance of being able to check your own pulse for safety during independent exercise       Expected Outcomes  Short Term: Able to explain why pulse checking is important during independent exercise;Long Term: Able to check pulse independently and accurately       Understanding of Exercise Prescription  Yes       Intervention  Provide education, explanation, and written materials on patient's individual exercise prescription       Expected Outcomes  Short Term: Able to explain program exercise prescription;Long Term: Able to explain home exercise prescription to exercise independently          Exercise Goals Re-Evaluation : Exercise Goals Re-Evaluation    Row Name 08/23/17 0805 09/01/17 4098 09/05/17 1442 09/20/17 1514 09/22/17 0810     Exercise Goal Re-Evaluation   Exercise Goals Review  Understanding of Exercise Prescription;Able to understand and use Dyspnea scale;Knowledge and understanding of Target Heart Rate Range (THRR);Able to understand and use rate of perceived exertion (RPE) scale  Understanding of Exercise Prescription;Able to understand and use Dyspnea scale;Knowledge and understanding of Target Heart Rate Range (THRR);Able to understand and use  rate of perceived exertion (RPE) scale;Increase Physical Activity;Increase Strength and Stamina;Able to check pulse independently  Increase Physical Activity;Understanding of Exercise Prescription;Increase Strength and Stamina  Increase Physical Activity;Understanding of Exercise Prescription;Increase Strength and Stamina  Increase Physical Activity;Understanding of Exercise Prescription;Increase Strength and Stamina   Comments  Reviewed RPE scale, THR and program prescription with pt today.  Pt voiced understanding and was given a copy of goals to take home.   Calyb is off to a good start in rehab.  He has already started to feel stronger and has more stamina.   He is already asking about what else he can do at home.  Reviewed home exercise with pt today.  Pt plans to use equipment he has at home for exercise.  [place] has a rowing machine, a bike, and a cross elliptical for use at home.  He also has some weights he can use at home as well.  Reviewed THR, pulse, RPE, sign and symptoms, NTG use, and when to call 911 or MD.  Also discussed weather considerations and indoor options.  Pt voiced understanding.  Strummer is doing well in rehab.  He continues to feel better in rehab and at home.  He has started to add in his home exercise and glad to be moving again. He has moved up to 5 lbs weights this week without a problem.  He wants to be able to pick up his daughter by her first birthday next month.  We will continue to work towards this goal.  Alyxander continues to do well in rehab.  He is getting stronger and more confident in what he is able to do.  He has been holding his daughter more but has yet to pick her up from the ground.  We will continue to work with his on his progression.    Drury is doing well in rehab.  He is enjoying exercise.  He has not started doing his home exercise as much.  He needs to clean out the exercise room.  He is going to go out more and take his daughter out some with him. He is  feeling stronger and has more stamina. He notices improvements each week.  He is also working on intervals.    Expected Outcomes  Short: Use RPE daily to regulate intensity.  Long: Follow program prescription in THR.  Short: Continue to add in home exercise.  Long: Continue to work to build strength and stamina back.   Short: Continue to build upper body strength  Long: Able to pick up daughter.   Short: Continue to buid confidence post surgery and returning to work.  Long: Continue to work towards picking up his daughter.   Short: Clean out exercise room and walk more. Long: Continue increase strength and stamina.    [place] Name 10/05/17 1042             Exercise Goal Re-Evaluation   Exercise Goals Review  Increase Physical Activity;Understanding of Exercise Prescription;Increase Strength and Stamina       Comments  Yasseen continues to do well in rehab.  He is really working hard on his intervals.  He jumps up to level 10 on T5 and level15 on XR for his intervlas.  We will continue to monitor his progression.        Expected Outcomes  Short: Continue to work on balance of work, exercise, and life.  Long: Continue to increase strength and stamina to be able to pick up daughter.           Discharge Exercise Prescription (Final Exercise Prescription Changes): Exercise Prescription Changes - 10/05/17 1100  Response to Exercise   Blood Pressure (Admit)  128/62    Blood Pressure (Exercise)  142/76    Blood Pressure (Exit)  102/62    Heart Rate (Admit)  92 bpm    Heart Rate (Exercise)  138 bpm    Heart Rate (Exit)  89 bpm    Rating of Perceived Exertion (Exercise)  13    Symptoms  none    Duration  Continue with 45 min of aerobic exercise without signs/symptoms of physical distress.    Intensity  THRR unchanged      Progression   Progression  Continue to progress workloads to maintain intensity without signs/symptoms of physical distress.    Average METs  6.1      Resistance Training    Training Prescription  Yes    Weight  5 lbs    Reps  10-15      Interval Training   Interval Training  Yes    Equipment  Treadmill;REL-XR;T5 Nustep    Comments  35mn on 140m off      Treadmill   MPH  3    Grade  8    Minutes  15    METs  6.6      REL-XR   Level  15    Minutes  15    METs  7.9      T5 Nustep   Level  10    Minutes  15    METs  4.7      Home Exercise Plan   Plans to continue exercise at  Home (comment) walking, ellipitical, rower, bike    Frequency  Add 3 additional days to program exercise sessions.    Initial Home Exercises Provided  09/01/17       Nutrition:  Target Goals: Understanding of nutrition guidelines, daily intake of sodium '1500mg'$ , cholesterol '200mg'$ , calories 30% from fat and 7% or less from saturated fats, daily to have 5 or more servings of fruits and vegetables.  Biometrics: Pre Biometrics - 08/21/17 1447      Pre Biometrics   Height  6' 1.6" (1.869 m)    Weight  219 lb 9.6 oz (99.6 kg)    Waist Circumference  40 inches    Hip Circumference  42 inches    Waist to Hip Ratio  0.95 %    BMI (Calculated)  28.52    Single Leg Stand  30 seconds        Nutrition Therapy Plan and Nutrition Goals: Nutrition Therapy & Goals - 09/04/17 0911      Nutrition Therapy   Diet  DASH/ TLC    Drug/Food Interactions  Statins/Certain Fruits    Protein (specify units)  13oz    Fiber  37 grams    Whole Grain Foods  4 servings    Saturated Fats  17 max. grams    Fruits and Vegetables  6 servings/day    Sodium  2000 grams      Personal Nutrition Goals   Nutrition Goal  Be a nutrition facts label reader! Identify foods and beverages high in sodium and fat    Personal Goal #2  Use salt-free flavorings such as citrus, ginger, dry herbs, low-salt dressings, and other salt-free seasonings    Personal Goal #3  Look for ways to reduce sodium when eating out. Manipulate portions, add protein/ vegetables, get dressings and sauces on the side, or swap  sides    Comments  Patient and his wife have started to  take steps to reduce dietary sodium intake, and have been cooking at home more.      Intervention Plan   Intervention  Nutrition handout(s) given to patient.;Prescribe, educate and counsel regarding individualized specific dietary modifications aiming towards targeted core components such as weight, hypertension, lipid management, diabetes, heart failure and other comorbidities. Following a low sodium diet    Expected Outcomes  Short Term Goal: Understand basic principles of dietary content, such as calories, fat, sodium, cholesterol and nutrients.;Short Term Goal: A plan has been developed with personal nutrition goals set during dietitian appointment.;Long Term Goal: Adherence to prescribed nutrition plan.       Nutrition Assessments: Nutrition Assessments - 08/21/17 1328      MEDFICTS Scores   Pre Score  6       Nutrition Goals Re-Evaluation: Nutrition Goals Re-Evaluation    [place] Name 09/01/17 0800 09/22/17 0818           Goals   Nutrition Goal  Meet with nutritionist with wife.    Be a nutrition facts label reader! Identify foods and beverages high in sodium and fat.  Salt free flavorings, eat out better, add protein      Comment  Denario is scheudled to meet with nutritionist on Monday with his wife to make sure he is doing what he should be.  He is looking forward to hearing her suggestions.  They have already reduced sodium and trying to eat chicken and salamon mostly.   Jong has been reading his food labels frequently, even when he goes out to eat.  He was shocked at how much sodium was in everything.  He is getting more protein as this will help build muscle mass back up.  He has been eating more chicken and salmon.  He has not had as many bugers..  We talked about trying bison.  He has been eating more Mrs. Dash.        Expected Outcome  Short: Meet with dietician.  Long: Continue to follow recommendations.   Short:  Continue to work sodium intake and eating better.  Long: Continue to eat heart healthy.          Nutrition Goals Discharge (Final Nutrition Goals Re-Evaluation): Nutrition Goals Re-Evaluation - 09/22/17 0818      Goals   Nutrition Goal  Be a nutrition facts label reader! Identify foods and beverages high in sodium and fat.  Salt free flavorings, eat out better, add protein    Comment  Zacharee has been reading his food labels frequently, even when he goes out to eat.  He was shocked at how much sodium was in everything.  He is getting more protein as this will help build muscle mass back up.  He has been eating more chicken and salmon.  He has not had as many bugers..  We talked about trying bison.  He has been eating more Mrs. Dash.      Expected Outcome  Short: Continue to work sodium intake and eating better.  Long: Continue to eat heart healthy.        Psychosocial: Target Goals: Acknowledge presence or absence of significant depression and/or stress, maximize coping skills, provide positive support system. Participant is able to verbalize types and ability to use techniques and skills needed for reducing stress and depression.   Initial Review & Psychosocial Screening: Initial Psych Review & Screening - 08/21/17 1331      Initial Review   Current issues with  Current Stress Concerns  Source of Stress Concerns  Unable to participate in former interests or hobbies;Unable to perform yard/household activities;Chronic Illness;Occupation    Comments  Harvir is fearful of his LifeVest firing inappropriately.  He has had it alarm on multiple occasions when nothing was really going on.  He has had it refitted multiple times.   It is frustrating to him to deal with.  Currently he is unable to drive and is relying on his wife for transportation, who has also been out of work sicne his event.  They also have a 88 month old daughter, Judson Roch, that he wishes he could pick up and hold.  She is heavy  handed and they were concerned about her hitting Daddy in the chest.  He really wants to be able to help care for his baby girl.  They take her to [facility] to stay with Mrs. Mikes mother during appointments.  They are planning to try to bring her to rehab to drop him off in the morning to see how that works for their schedule.  He is also worried about returning to work when it is time as he was keeping crazy hours working here and with his bosses in [place].  He was surviving on limited sleep (2-4 hrs) each night and working in a stressful job trying to prove himself since he was just rehired just before his daughter was born.  Previously, the company had laid him off for 6 months.  He is excited about starting the program and rebuidling his confidence and strength.       Family Dynamics   Good Support System?  Yes wife, family friends  pastor    Comments  Wife and family are very supportive. She flew out to [facility] to stay with him for a month during his hospitalization and recovery.        Barriers   Psychosocial barriers to participate in program  There are no identifiable barriers or psychosocial needs.;The patient should benefit from training in stress management and relaxation.      Screening Interventions   Interventions  Encouraged to exercise;To provide support and resources with identified psychosocial needs;Provide feedback about the scores to participant;Program counselor consult    Expected Outcomes  Short Term goal: Utilizing psychosocial counselor, staff and physician to assist with identification of specific Stressors or current issues interfering with healing process. Setting desired goal for each stressor or current issue identified.;Long Term Goal: Stressors or current issues are controlled or eliminated.;Short Term goal: Identification and review with participant of any Quality of Life or Depression concerns found by scoring the questionnaire.;Long Term goal: The participant improves  quality of Life and PHQ9 Scores as seen by post scores and/or verbalization of changes       Quality of Life Scores:  Quality of Life - 08/21/17 1333      Quality of Life Scores   Health/Function Pre  24.86 %    Socioeconomic Pre  29.14 %    Psych/Spiritual Pre  30 %    Family Pre  30 %    GLOBAL Pre  27.64 %      Scores of 19 and below usually indicate a poorer quality of life in these areas.  A difference of  2-3 points is a clinically meaningful difference.  A difference of 2-3 points in the total score of the Quality of Life Index has been associated with significant improvement in overall quality of life, self-image, physical symptoms, and general health in studies assessing change  in quality of life.  PHQ-9: Recent Review Flowsheet Data    Depression screen [employer] 2/9 08/21/2017   Decreased Interest 0   Down, Depressed, Hopeless 0   PHQ - 2 Score 0   Altered sleeping 0   Tired, decreased energy 1   Change in appetite 1   Feeling bad or failure about yourself  0   Trouble concentrating 0   Moving slowly or fidgety/restless 0   Suicidal thoughts 0   PHQ-9 Score 2   Difficult doing work/chores Not difficult at all     Interpretation of Total Score  Total Score Depression Severity:  1-4 = Minimal depression, 5-9 = Mild depression, 10-14 = Moderate depression, 15-19 = Moderately severe depression, 20-27 = Severe depression   Psychosocial Evaluation and Intervention: Psychosocial Evaluation - 08/23/17 0959      Psychosocial Evaluation & Interventions   Interventions  Encouraged to exercise with the program and follow exercise prescription;Stress management education;Relaxation education    Comments  Counselor met with Mr. Costabile Wixom) and his spouse today for initial psychosocial evaluation.  He is a 42 year old who had (2) heart attacks on 1/8 while on a business trip to [place].  He had a CABGx5 as a result.  Dustan has a strong support system with a spouse of 7 years;  family and friends locally and active involvement in their local church.  Adain is healthy - other than his heart and some seasonal allergies.  He is sleeping "better" since the surgery with approximatelly 6 hours/night.  Golden reports he was sleeping approximately 10 hours/week prior to his surgery and realizes how detrimental this was to his health.  Raysean is getting his appetite back as well and is hoping to maintain his healthier weight and eating habits.  He denies a history of depression or anxiety or any current symptoms.  His mood is typically positive - although he feels tired much of the time.  Current stress in his life includes the "Life Vest" he has to wear that goes off frequently; finances since his spouse has taken time off without pay; his health and they have a new baby that is less than a year old.  Jamoni is unable to drive currently or hold his baby so these are some restrictions he hopes change soon.  He has goals to increase his stamina and strength; exercise consistently and be in "better health" than he was prior to the surgery.  Wilmer and his spouse have exercise equipment at home they are planning to use more consistently once he completes this program.  Staff will follow with Jereme throughout the course of this program.      Expected Outcomes  Danzell will benefit from consistent exercise to achieve his stated goals.  The educational and psychoeducational components of this program will be helpful in understanding and coping more positively with his condition.      Continue Psychosocial Services   Follow up required by staff       Psychosocial Re-Evaluation: Psychosocial Re-Evaluation    [place] Name 08/30/17 1018 09/06/17 0843 09/18/17 0927 09/20/17 1513 09/22/17 8299     Psychosocial Re-Evaluation   Current issues with  Current Stress Concerns  Current Stress Concerns  Current Stress Concerns  -  Current Stress Concerns   Comments  Counselor follow up with Jacki Cones  stating he is enjoying this class and is feeling stronger already.  He continues to struggle with the life vest firing often, although he appears  to be more relaxed about this.  He is reporting less stress currently and better sleep with a recent change in medication.  counselor commended him for his consistency in exercise and commitment to improved health.    Dierre no longer has to wear his LifeVest.  They reviewed the records yesterday and found that it had been double counting his rate.  The rep contacted Dr. Tamala Julian and Dr. Caryl Comes to review results.  After discussion with physicians, Traxton was released from having to wear the LifeVest.   He is so excited!!  We also talked about the progression of holding his daughter and wearing her now.    Counselor follow up with Jacki Cones today.  He reports feeling so much better since the LifeVest use was discontinued.  He is resting well and should see his Dr. today to release him for returning to work gradually.  Counselor discussed progress made with Jay reporting sleeping well; feeling stronger; more energy; getting back to more normal life activities and enjoying holding his new infant daughter.  Dameon is cautious returning to work and plans to start with just one project and take it slow.  Counselor discussed a plan to prevent returning to the same level of stress that precipitated his cardiac event.  Shun is aware and is being proactive with this - even considering finding alternative employment if needed.  He plans to complete Cardiac Rehab end of April-early May.  Counselor encouraged Korry to incorporate a life coach if he has difficulty setting boundaries and limits with his employer.  Counselor commended Nurse, mental health for all of his progress made and commitment to positive self care and being proactive in his plan to return to work gradually.    Aundrea will be returning to work part time, which means full time but working from home.  He hopes to be able to  continue in rehab at his current time, but he will let us know.   Ryer will be returning to work on Monday part time at home.  He is worried about adding back in the long hours.  He is going to be working on only one project to start and limit his time at meetings. He will still need to make sure that he takes time to himself to get his exercise.  He has been sleeping better.  He is planning to cut back on night calls and going to bed afterwards.  He is learning to level everything out.     Expected Outcomes  Singleton will continue to exercise consistently for his health and mental health.  He will continue to take his medications which are helping him rest and refresh.  He will continue to use the LifeVest until the Dr. orders otherwise following his next Echo in the next month.  Staff will follow.    Short: Enjoy his new freedom.  Long: Continue to work toward being able to carry his daughter  Mahlon will continue to practice positive self-care and set realistic expectations for his gradual return to work.  He will complete the Cardiac Rehab program.    -  Short: Manage going back to work and maintain self care. Long: Continue to remain postive and celebrate each goal met.    Interventions  Stress management education;Relaxation education  -  Stress management education  -  Encouraged to attend Cardiac Rehabilitation for the exercise   Continue Psychosocial Services   -  -  Follow up required by staff  -  Follow up  required by staff     Initial Review   Source of Stress Concerns  -  -  -  -  Occupation;Unable to perform yard/household activities;Unable to participate in former interests or hobbies   [place] Name 10/02/17 [phone] 10/09/17 0831           Psychosocial Re-Evaluation   Current issues with  -  Current Stress Concerns      Comments  Counselor follow up with Jacki Cones today to see if had returned to work, but reported there has been a delay.  He continues to do well and is practicing better  boundaries in his life to prevent the stress he experienced prior to his heart attack.  He is also working out more consistently and reports feeling stronger.  Counselor commended Vladimir on the progress made and his commitment to positive self-care.    Brandun was able to carry his duaghter around this weekend!!  He was excited to be able to hold her while standing again. He is going to build up slowly!!      Expected Outcomes  Jossue will graduate into part-time work and has the capacity to work remotely from home initially.  He will continue to set healthy boundaries and limits and if his place of employment cannot respect those Krish is committed to looking elsewhere if needed.  Counselor commended Cayden on his progress made and self-care.  Short: Hold daughter.  Long: Pick up daughter from ground.       Interventions  Relaxation education;Stress management education  -      Continue Psychosocial Services   Follow up required by staff  -         Psychosocial Discharge (Final Psychosocial Re-Evaluation): Psychosocial Re-Evaluation - 10/09/17 0831      Psychosocial Re-Evaluation   Current issues with  Current Stress Concerns    Comments  Anthony was able to carry his duaghter around this weekend!!  He was excited to be able to hold her while standing again. He is going to build up slowly!!    Expected Outcomes  Short: Hold daughter.  Long: Pick up daughter from ground.        Vocational Rehabilitation: Provide vocational rehab assistance to qualifying candidates.   Vocational Rehab Evaluation & Intervention: Vocational Rehab - 08/21/17 1335      Initial Vocational Rehab Evaluation & Intervention   Assessment shows need for Vocational Rehabilitation  No       Education: Education Goals: Education classes will be provided on a variety of topics geared toward better understanding of heart health and risk factor modification. Participant will state understanding/return demonstration  of topics presented as noted by education test scores.  Learning Barriers/Preferences: Learning Barriers/Preferences - 08/21/17 1334      Learning Barriers/Preferences   Learning Barriers  None    Learning Preferences  Computer/Internet;Individual Instruction;Verbal Instruction;Video;Written Material       Education Topics:  AED/CPR: - Group verbal and written instruction with the use of models to demonstrate the basic use of the AED with the basic ABC's of resuscitation.   Cardiac Rehab from 10/09/2017 in [facility] Cardiac and Pulmonary Rehab  Date  08/28/17  Educator  SB  Instruction Review Code  1- Verbalizes Understanding      General Nutrition Guidelines/Fats and Fiber: -Group instruction provided by verbal, written material, models and posters to present the general guidelines for heart healthy nutrition. Gives an explanation and review of dietary fats and fiber.   Cardiac Rehab from 10/09/2017  in [facility] Cardiac and Pulmonary Rehab  Date  10/09/17  Educator  CR  Instruction Review Code  1- Verbalizes Understanding      Controlling Sodium/Reading Food Labels: -Group verbal and written material supporting the discussion of sodium use in heart healthy nutrition. Review and explanation with models, verbal and written materials for utilization of the food label.   Exercise Physiology & General Exercise Guidelines: - Group verbal and written instruction with models to review the exercise physiology of the cardiovascular system and associated critical values. Provides general exercise guidelines with specific guidelines to those with heart or lung disease.    Cardiac Rehab from 10/09/2017 in [facility] Cardiac and Pulmonary Rehab  Date  09/04/17  Educator  [facility]  Instruction Review Code  1- Verbalizes Understanding      Aerobic Exercise & Resistance Training: - Gives group verbal and written instruction on the various components of exercise. Focuses on aerobic and resistive training programs  and the benefits of this training and how to safely progress through these programs..   Flexibility, Balance, Mind/Body Relaxation: Provides group verbal/written instruction on the benefits of flexibility and balance training, including mind/body exercise modes such as yoga, pilates and tai chi.  Demonstration and skill practice provided.   Stress and Anxiety: - Provides group verbal and written instruction about the health risks of elevated stress and causes of high stress.  Discuss the correlation between heart/lung disease and anxiety and treatment options. Review healthy ways to manage with stress and anxiety.   Cardiac Rehab from 10/09/2017 in [facility] Cardiac and Pulmonary Rehab  Date  09/20/17  Educator  [facility]  Instruction Review Code  1- Verbalizes Understanding      Depression: - Provides group verbal and written instruction on the correlation between heart/lung disease and depressed mood, treatment options, and the stigmas associated with seeking treatment.   Cardiac Rehab from 10/09/2017 in [facility] Cardiac and Pulmonary Rehab  Date  09/06/17  Educator  [facility]  Instruction Review Code  1- Verbalizes Understanding      Anatomy & Physiology of the Heart: - Group verbal and written instruction and models provide basic cardiac anatomy and physiology, with the coronary electrical and arterial systems. Review of Valvular disease and Heart Failure   Cardiac Rehab from 10/09/2017 in [facility] Cardiac and Pulmonary Rehab  Date  10/02/17  Educator  SB  Instruction Review Code  1- Verbalizes Understanding      Cardiac Procedures: - Group verbal and written instruction to review commonly prescribed medications for heart disease. Reviews the medication, class of the drug, and side effects. Includes the steps to properly store meds and maintain the prescription regimen. (beta blockers and nitrates)   Cardiac Medications I: - Group verbal and written instruction to review commonly prescribed medications  for heart disease. Reviews the medication, class of the drug, and side effects. Includes the steps to properly store meds and maintain the prescription regimen.   Cardiac Rehab from 10/09/2017 in [facility] Cardiac and Pulmonary Rehab  Date  09/25/17  Educator  SB  Instruction Review Code  1- Verbalizes Understanding      Cardiac Medications II: -Group verbal and written instruction to review commonly prescribed medications for heart disease. Reviews the medication, class of the drug, and side effects. (all other drug classes)   Cardiac Rehab from 10/09/2017 in [facility] Cardiac and Pulmonary Rehab  Date  09/18/17  Educator  [facility]  Instruction Review Code  1- Verbalizes Understanding       Go Sex-Intimacy &  Heart Disease, Get SMART - Goal Setting: - Group verbal and written instruction through game format to discuss heart disease and the return to sexual intimacy. Provides group verbal and written material to discuss and apply goal setting through the application of the S.M.A.R.T. Method.   Other Matters of the Heart: - Provides group verbal, written materials and models to describe Stable Angina and Peripheral Artery. Includes description of the disease process and treatment options available to the cardiac patient.   Cardiac Rehab from 10/09/2017 in [facility] Cardiac and Pulmonary Rehab  Date  10/02/17  Educator  SB  Instruction Review Code  1- Verbalizes Understanding      Exercise & Equipment Safety: - Individual verbal instruction and demonstration of equipment use and safety with use of the equipment.   Cardiac Rehab from 10/09/2017 in [facility] Cardiac and Pulmonary Rehab  Date  08/21/17  Educator  [facility]  Instruction Review Code  1- Verbalizes Understanding      Infection Prevention: - Provides verbal and written material to individual with discussion of infection control including proper hand washing and proper equipment cleaning during exercise session.   Cardiac Rehab from 10/09/2017 in [facility]  Cardiac and Pulmonary Rehab  Date  08/21/17  Educator  [facility]  Instruction Review Code  1- Verbalizes Understanding      Falls Prevention: - Provides verbal and written material to individual with discussion of falls prevention and safety.   Cardiac Rehab from 10/09/2017 in [facility] Cardiac and Pulmonary Rehab  Date  08/21/17  Educator  [facility]  Instruction Review Code  1- Verbalizes Understanding      Diabetes: - Individual verbal and written instruction to review signs/symptoms of diabetes, desired ranges of glucose level fasting, after meals and with exercise. Acknowledge that pre and post exercise glucose checks will be done for 3 sessions at entry of program.   Know Your Numbers and Risk Factors: -Group verbal and written instruction about important numbers in your health.  Discussion of what are risk factors and how they play a role in the disease process.  Review of Cholesterol, Blood Pressure, Diabetes, and BMI and the role they play in your overall health.   Cardiac Rehab from 10/09/2017 in [facility] Cardiac and Pulmonary Rehab  Date  09/18/17  Educator  [facility]  Instruction Review Code  1- Verbalizes Understanding      Sleep Hygiene: -Provides group verbal and written instruction about how sleep can affect your health.  Define sleep hygiene, discuss sleep cycles and impact of sleep habits. Review good sleep hygiene tips.    Cardiac Rehab from 10/09/2017 in [facility] Cardiac and Pulmonary Rehab  Date  10/04/17  Educator  [facility]  Instruction Review Code  1- Verbalizes Understanding      Other: -Provides group and verbal instruction on various topics (see comments)   Knowledge Questionnaire Score: Knowledge Questionnaire Score - 08/21/17 1334      Knowledge Questionnaire Score   Pre Score  22/28 Reviewed correct responses with Jacki Cones. He verbalized understanding of the responses and had no further questions today.       Core Components/Risk Factors/Patient Goals at Admission: Personal Goals  and Risk Factors at Admission - 08/21/17 1329      Core Components/Risk Factors/Patient Goals on Admission    Weight Management  Yes;Weight Maintenance    Intervention  Weight Management: Develop a combined nutrition and exercise program designed to reach desired caloric intake, while maintaining appropriate intake of nutrient and fiber, sodium and fats, and appropriate energy  expenditure required for the weight goal.;Weight Management: Provide education and appropriate resources to help participant work on and attain dietary goals. Loss about 20 pounds while in hospital and at discharge.  210 is goal.     Admit Weight  219 lb 9.6 oz (99.6 kg)    Goal Weight: Short Term  217 lb (98.4 kg)    Goal Weight: Long Term  210 lb (95.3 kg)    Expected Outcomes  Short Term: Continue to assess and modify interventions until short term weight is achieved;Long Term: Adherence to nutrition and physical activity/exercise program aimed toward attainment of established weight goal    Heart Failure  Yes Wears a Life Vest.  Next echo in a few months    Intervention  Provide a combined exercise and nutrition program that is supplemented with education, support and counseling about heart failure. Directed toward relieving symptoms such as shortness of breath, decreased exercise tolerance, and extremity edema.    Expected Outcomes  Improve functional capacity of life;Short term: Attendance in program 2-3 days a week with increased exercise capacity. Reported lower sodium intake. Reported increased fruit and vegetable intake. Reports medication compliance.;Short term: Daily weights obtained and reported for increase. Utilizing diuretic protocols set by physician.;Long term: Adoption of self-care skills and reduction of barriers for early signs and symptoms recognition and intervention leading to self-care maintenance.    Lipids  Yes    Intervention  Provide education and support for participant on nutrition &  aerobic/resistive exercise along with prescribed medications to achieve LDL '70mg'$ , HDL >'40mg'$ .    Expected Outcomes  Short Term: Participant states understanding of desired cholesterol values and is compliant with medications prescribed. Participant is following exercise prescription and nutrition guidelines.;Long Term: Cholesterol controlled with medications as prescribed, with individualized exercise RX and with personalized nutrition plan. Value goals: LDL < '70mg'$ , HDL > 40 mg.       Core Components/Risk Factors/Patient Goals Review:  Goals and Risk Factor Review    Row Name 09/01/17 0755 09/22/17 0813           Core Components/Risk Factors/Patient Goals Review   Personal Goals Review  Weight Management/Obesity;Heart Failure;Lipids  Weight Management/Obesity;Heart Failure;Lipids      Review  Deckard is off to a good start in rehab.  He is already starting to lose weight.  When he is at home he is down to 213 lbs and dressed at 221 lbs.  His goal was to get down to 210 lbs so he is well on his way.  He is sticking with his diet and exercising regular.    So now he wants to work on getting his muscle mass back.  He has not had any heart failure sysmptoms and has been weighing daily.  His legs have stopped tingling overall.    His circulation is improving and he is not as cold.  He is learning to appreciate the step and make smalll celebrations.   Meds seem to be working for him.   Stefen is doing well in rehab.  He is feeling better and his incision is feeling better.  He has been able to cough and sneeze without the need of his pillow.  It is starting to feel more normal and heal better.   Weight has been doing good and is down to 220 lbs here and holding steady, He has continued to work on diet and exercise.  He has not had any heart failure symptoms.  His meds are working for him.  He continues to have improved circulataion as he was not cold at the office last week.       Expected Outcomes  Short:  Continue to work on weight loss and buidling muscle mass.  Long: Continue to work to improve heart function.   Short: Continue to work on weight loss.  Long: Continue to improve heart.          Core Components/Risk Factors/Patient Goals at Discharge (Final Review):  Goals and Risk Factor Review - 09/22/17 0813      Core Components/Risk Factors/Patient Goals Review   Personal Goals Review  Weight Management/Obesity;Heart Failure;Lipids    Review  Giann is doing well in rehab.  He is feeling better and his incision is feeling better.  He has been able to cough and sneeze without the need of his pillow.  It is starting to feel more normal and heal better.   Weight has been doing good and is down to 220 lbs here and holding steady, He has continued to work on diet and exercise.  He has not had any heart failure symptoms.  His meds are working for him.  He continues to have improved circulataion as he was not cold at the office last week.     Expected Outcomes  Short: Continue to work on weight loss.  Long: Continue to improve heart.        ITP Comments: ITP Comments    Row Name 08/21/17 1321 08/28/17 0805 09/06/17 0841 09/13/17 0650 10/09/17 0830   ITP Comments  Medical review completed  ITP sent to Dr Sabra Heck for review , changes as needed and signature.  Documentation can be found in [employer] Office Note from [employer] 0/28/2019  Called company about [place], they were not concerned.  He is going to try to call the local rep this week to see if someone could come look at it again.   Javad no longer has to wear his LifeVest.  They reviewed the records yesterday and found that it had been double counting his rate.  The rep contacted Dr. Tamala Julian and Dr. Caryl Comes to review results.  After discussion with physicians, Damione was released from having to wear the LifeVest.   30 day review completed. Continue with ITP unless diercted changes per Medical Director.  OUt this past week due to family  members with the flu  Zacarias was able to carry his duaghter around this weekend!!   [place] Name 10/11/17 0619           ITP Comments  30 Day review. Continue with ITP unless directed changes per Medical Director review.            Comments:

## 2017-10-11 NOTE — Progress Notes (Addendum)
Daily Session Note  Patient Details  Name: Brad Lee MRN: [id number] Date of Birth: 04/05/1976 Referring Provider:     Cardiac Rehab from 08/21/2017 in [facility] Cardiac and Pulmonary Rehab  Referring Provider  Smith, Henry MD      Encounter Date: 10/11/2017  Check In: Session Check In - 10/11/17 0749      Check-In   Location  [facility]Cardiac & Pul[facility] Rehab    Staff Present  Joseph Hood RCP,RRT,BSRT;Susanne Bice, RN, BSN, CCRP;Jessica Hawkins, MA, RCEP, CCRP, Exercise Physiologist    Supervising physician immediately available to respond to emergencies  See telemetry face sheet for immediately available ER MD    Medication changes reported      No    Fall or balance concerns reported     No    Tobacco Cessation  No Change    Warm-up and Cool-down  Performed on first and last piece of equipment    Resistance Training Performed  Yes    VAD Patient?  No      Pain Assessment   Currently in Pain?  No/denies          Social History   Tobacco Use  Smoking Status Never Smoker  Smokeless Tobacco Never Used    Goals Met:  Independence with exercise equipment Exercise tolerated well No report of cardiac concerns or symptoms Strength training completed today  Goals Unmet:  Not Applicable  Comments: Pt able to follow exercise prescription today without complaint.  Will continue to monitor for progression. 6 Minute Walk    Row Name 08/21/17 1444 10/11/17 0949       6 Minute Walk   Phase  Initial  Discharge    Distance  1060 feet  2000 feet    Distance % Change  -  88.6 %    Distance Feet Change  -  940 ft    Walk Time  6 minutes  6 minutes    # of Rest Breaks  0  0    MPH  2  3.79    METS  4.5  6.44    RPE  12  12    VO2 Peak  15.73  22.56    Symptoms  No  No    Resting HR  87 bpm  85 bpm    Resting BP  156/80 worried about LifeVest firing during test  132/70    Resting Oxygen Saturation   99 %  -    Exercise Oxygen Saturation  during 6 min walk  100 %  -    Max Ex.  HR  120 bpm  131 bpm    Max Ex. BP  146/64  164/80    2 Minute Post BP  134/66  -        Dr. Mark Miller is Medical Director for [facility] and [facility]. 

## 2017-10-12 ENCOUNTER — Telehealth: Payer: Self-pay | Admitting: Interventional Cardiology

## 2017-10-12 NOTE — Telephone Encounter (Signed)
New Message   Pt states he will be graduating from rehab next Friday but his paper says his last day isn't until May 3 and wants to know why. Please call

## 2017-10-12 NOTE — Telephone Encounter (Signed)
Spoke with pt and advised he would need to speak to the rehab team about this as I am unsure about the process for finishing the program.  Advised they would better be able to guide him through this.  Pt verbalized understanding and was appreciative for call.

## 2017-10-13 DIAGNOSIS — I213 ST elevation (STEMI) myocardial infarction of unspecified site: Secondary | ICD-10-CM | POA: Diagnosis not present

## 2017-10-13 NOTE — Progress Notes (Signed)
Daily Session Note  Patient Details  Name: Brad Lee MRN: [id number] Date of Birth: 1976-03-16 Referring Provider:     Cardiac Rehab from 08/21/2017 in [facility] Cardiac and Pulmonary Rehab  Referring Provider  Daneen Schick MD      Encounter Date: 10/13/2017  Check In: Session Check In - 10/13/17 0825      Check-In   Location  [facility]    Staff Present  Alberteen Sam, MA, RCEP, CCRP, Exercise Physiologist;Meredith Sherryll Burger, RN Vickki Hearing, BA, ACSM CEP, Exercise Physiologist    Supervising physician immediately available to respond to emergencies  See telemetry face sheet for immediately available ER MD    Medication changes reported      No    Fall or balance concerns reported     No    Warm-up and Cool-down  Performed on first and last piece of equipment    Resistance Training Performed  Yes    VAD Patient?  No      Pain Assessment   Currently in Pain?  No/denies    Multiple Pain Sites  No          Social History   Tobacco Use  Smoking Status Never Smoker  Smokeless Tobacco Never Used    Goals Met:  Independence with exercise equipment Exercise tolerated well No report of cardiac concerns or symptoms Strength training completed today  Goals Unmet:  Not Applicable  Comments: Pt able to follow exercise prescription today without complaint.  Will continue to monitor for progression.    Dr. Emily Filbert is Medical Director for [place] and [facility].

## 2017-10-16 ENCOUNTER — Encounter: Payer: Managed Care, Other (non HMO) | Attending: Interventional Cardiology | Admitting: *Deleted

## 2017-10-16 DIAGNOSIS — Z7951 Long term (current) use of inhaled steroids: Secondary | ICD-10-CM | POA: Diagnosis not present

## 2017-10-16 DIAGNOSIS — Z79899 Other long term (current) drug therapy: Secondary | ICD-10-CM | POA: Insufficient documentation

## 2017-10-16 DIAGNOSIS — Z7982 Long term (current) use of aspirin: Secondary | ICD-10-CM | POA: Insufficient documentation

## 2017-10-16 DIAGNOSIS — I2510 Atherosclerotic heart disease of native coronary artery without angina pectoris: Secondary | ICD-10-CM | POA: Diagnosis not present

## 2017-10-16 DIAGNOSIS — I213 ST elevation (STEMI) myocardial infarction of unspecified site: Secondary | ICD-10-CM | POA: Diagnosis present

## 2017-10-16 DIAGNOSIS — Z951 Presence of aortocoronary bypass graft: Secondary | ICD-10-CM | POA: Insufficient documentation

## 2017-10-16 NOTE — Progress Notes (Signed)
Daily Session Note  Patient Details  Name: Brad Lee MRN: [id number] Date of Birth: [birth year] Referring Provider:     Cardiac Rehab from 08/21/2017 in [facility] Pul[facility] Rehab  Referring Provider  Daneen Schick MD      Encounter Date: 10/16/2017  Check In: Session Check In - 10/16/17 0844      Check-In   Location  [facility]    Staff Present  Earlean Shawl, BS, ACSM CEP, Exercise Physiologist;Jessica Luan Pulling, MA, RCEP, CCRP, Exercise Physiologist;Susanne Bice, RN, BSN, CCRP    Supervising physician immediately available to respond to emergencies  See telemetry face sheet for immediately available ER MD    Medication changes reported      No    Fall or balance concerns reported     No    Warm-up and Cool-down  Performed on first and last piece of equipment    Resistance Training Performed  Yes    VAD Patient?  No      Pain Assessment   Currently in Pain?  No/denies    Multiple Pain Sites  No          Social History   Tobacco Use  Smoking Status Never Smoker  Smokeless Tobacco Never Used    Goals Met:  Independence with exercise equipment Exercise tolerated well No report of cardiac concerns or symptoms Strength training completed today  Goals Unmet:  Not Applicable  Comments:Pt able to follow exercise prescription today without complaint.  Will continue to monitor for progression.    Dr. Emily Filbert is Medical Director for [place] and [facility].

## 2017-10-20 ENCOUNTER — Other Ambulatory Visit: Payer: Self-pay

## 2017-10-20 ENCOUNTER — Ambulatory Visit (HOSPITAL_COMMUNITY): Payer: Managed Care, Other (non HMO) | Attending: Interventional Cardiology

## 2017-10-20 DIAGNOSIS — Z951 Presence of aortocoronary bypass graft: Secondary | ICD-10-CM | POA: Insufficient documentation

## 2017-10-20 DIAGNOSIS — I2510 Atherosclerotic heart disease of native coronary artery without angina pectoris: Secondary | ICD-10-CM | POA: Insufficient documentation

## 2017-10-20 DIAGNOSIS — R55 Syncope and collapse: Secondary | ICD-10-CM | POA: Diagnosis not present

## 2017-10-20 DIAGNOSIS — I252 Old myocardial infarction: Secondary | ICD-10-CM | POA: Insufficient documentation

## 2017-10-20 DIAGNOSIS — I5022 Chronic systolic (congestive) heart failure: Secondary | ICD-10-CM

## 2017-10-20 DIAGNOSIS — I255 Ischemic cardiomyopathy: Secondary | ICD-10-CM | POA: Insufficient documentation

## 2017-10-20 DIAGNOSIS — I081 Rheumatic disorders of both mitral and tricuspid valves: Secondary | ICD-10-CM | POA: Diagnosis not present

## 2017-10-20 MED ORDER — PERFLUTREN LIPID MICROSPHERE
1.0000 mL | INTRAVENOUS | Status: AC | PRN
  Administered 2017-10-20: 2 mL via INTRAVENOUS

## 2017-10-25 DIAGNOSIS — I213 ST elevation (STEMI) myocardial infarction of unspecified site: Secondary | ICD-10-CM

## 2017-10-25 NOTE — Progress Notes (Signed)
Daily Session Note  Patient Details  Name: Brad Lee MRN: [id number] Date of Birth: October 28, 1975 Referring Provider:     Cardiac Rehab from 08/21/2017 in [facility]iac and Pulmonary Rehab  Referring Provider  Daneen Schick MD      Encounter Date: 10/25/2017  Check In: Session Check In - 10/25/17 0753      Check-In   Location  [facility]    Staff Present  Justin Mend RCP,RRT,BSRT;Heath Lark, RN, BSN, CCRP;Jessica Luan Pulling, MA, RCEP, CCRP, Exercise Physiologist    Supervising physician immediately available to respond to emergencies  See telemetry face sheet for immediately available ER MD    Medication changes reported      No    Fall or balance concerns reported     No    Tobacco Cessation  No Change    Warm-up and Cool-down  Performed on first and last piece of equipment    Resistance Training Performed  Yes    VAD Patient?  No      Pain Assessment   Currently in Pain?  No/denies          Social History   Tobacco Use  Smoking Status Never Smoker  Smokeless Tobacco Never Used    Goals Met:  Independence with exercise equipment Exercise tolerated well No report of cardiac concerns or symptoms Strength training completed today  Goals Unmet:  Not Applicable  Comments: Pt able to follow exercise prescription today without complaint.  Will continue to monitor for progression.   Dr. Emily Filbert is Medical Director for [place] and [facility].

## 2017-10-26 ENCOUNTER — Other Ambulatory Visit: Payer: Self-pay | Admitting: *Deleted

## 2017-10-26 MED ORDER — ATORVASTATIN CALCIUM 40 MG PO TABS: 40.0000 mg | ORAL_TABLET | Freq: Every day | ORAL | 3 refills | Status: DC

## 2017-10-27 NOTE — Patient Instructions (Signed)
Discharge Patient Instructions  Patient Details  Name: Brad Lee MRN: [id number] Date of Birth: Jul 25, 1975 Referring Provider:  Belva Crome, MD   Number of Visits: 36/36  Reason for Discharge:  Patient reached a stable level of exercise. Patient independent in their exercise. Patient has met program and personal goals.  Smoking History:  Social History   Tobacco Use  Smoking Status Never Smoker  Smokeless Tobacco Never Used    Diagnosis:  ST elevation myocardial infarction (STEMI), unspecified artery (HCC)  Initial Exercise Prescription: Initial Exercise Prescription - 08/21/17 1400      Date of Initial Exercise RX and Referring Provider   Date  08/21/17    Referring Provider  Daneen Schick MD      Treadmill   MPH  2    Grade  1    Minutes  15    METs  2.81      REL-XR   Level  3    Speed  50    Minutes  15    METs  2.5      T5 Nustep   Level  3    SPM  80    Minutes  15    METs  2.5      Prescription Details   Frequency (times per week)  3    Duration  Progress to 45 minutes of aerobic exercise without signs/symptoms of physical distress      Intensity   THRR 40-80% of Max Heartrate  115-159    Ratings of Perceived Exertion  11-13    Perceived Dyspnea  0-4      Progression   Progression  Continue to progress workloads to maintain intensity without signs/symptoms of physical distress.      Resistance Training   Training Prescription  Yes    Weight  2 lbs    Reps  10-15       Discharge Exercise Prescription (Final Exercise Prescription Changes): Exercise Prescription Changes - 10/17/17 1400      Response to Exercise   Blood Pressure (Admit)  128/60    Blood Pressure (Exercise)  134/60    Blood Pressure (Exit)  144/70    Heart Rate (Admit)  99 bpm    Heart Rate (Exercise)  130 bpm    Heart Rate (Exit)  96 bpm    Rating of Perceived Exertion (Exercise)  12.5    Symptoms  none    Duration  Continue with 45 min of aerobic exercise  without signs/symptoms of physical distress.    Intensity  THRR unchanged      Progression   Progression  Continue to progress workloads to maintain intensity without signs/symptoms of physical distress.    Average METs  6.67      Resistance Training   Training Prescription  Yes    Weight  6 lbs    Reps  10-15      Interval Training   Interval Training  Yes    Equipment  Treadmill;REL-XR;T5 Nustep    Comments  69mn on 139m off      Treadmill   MPH  3    Grade  8    Minutes  15    METs  6.6      REL-XR   Level  15    Minutes  15    METs  7.9      T5 Nustep   Level  10    Minutes  15    METs  5.5  Home Exercise Plan   Plans to continue exercise at  Home (comment) walking, ellipitical, rower, bike    Frequency  Add 3 additional days to program exercise sessions.    Initial Home Exercises Provided  09/01/17       Functional Capacity: 6 Minute Walk    Row Name 08/21/17 1444 10/11/17 0949       6 Minute Walk   Phase  Initial  Discharge    Distance  1060 feet  2000 feet    Distance % Change  -  88.6 %    Distance Feet Change  -  940 ft    Walk Time  6 minutes  6 minutes    # of Rest Breaks  0  0    MPH  2  3.79    METS  4.5  6.44    RPE  12  12    VO2 Peak  15.73  22.56    Symptoms  No  No    Resting HR  87 bpm  85 bpm    Resting BP  156/80 worried about LifeVest firing during test  132/70    Resting Oxygen Saturation   99 %  -    Exercise Oxygen Saturation  during 6 min walk  100 %  -    Max Ex. HR  120 bpm  131 bpm    Max Ex. BP  146/64  164/80    2 Minute Post BP  134/66  -       Quality of Life: Quality of Life - 08/21/17 1333      Quality of Life Scores   Health/Function Pre  24.86 %    Socioeconomic Pre  29.14 %    Psych/Spiritual Pre  30 %    Family Pre  30 %    GLOBAL Pre  27.64 %       Personal Goals: Goals established at orientation with interventions provided to work toward goal. Personal Goals and Risk Factors at Admission -  08/21/17 1329      Core Components/Risk Factors/Patient Goals on Admission    Weight Management  Yes;Weight Maintenance    Intervention  Weight Management: Develop a combined nutrition and exercise program designed to reach desired caloric intake, while maintaining appropriate intake of nutrient and fiber, sodium and fats, and appropriate energy expenditure required for the weight goal.;Weight Management: Provide education and appropriate resources to help participant work on and attain dietary goals. Loss about 20 pounds while in hospital and at discharge.  210 is goal.     Admit Weight  219 lb 9.6 oz (99.6 kg)    Goal Weight: Short Term  217 lb (98.4 kg)    Goal Weight: Long Term  210 lb (95.3 kg)    Expected Outcomes  Short Term: Continue to assess and modify interventions until short term weight is achieved;Long Term: Adherence to nutrition and physical activity/exercise program aimed toward attainment of established weight goal    Heart Failure  Yes Wears a Life Vest.  Next echo in a few months    Intervention  Provide a combined exercise and nutrition program that is supplemented with education, support and counseling about heart failure. Directed toward relieving symptoms such as shortness of breath, decreased exercise tolerance, and extremity edema.    Expected Outcomes  Improve functional capacity of life;Short term: Attendance in program 2-3 days a week with increased exercise capacity. Reported lower sodium intake. Reported increased fruit and vegetable intake. Reports  medication compliance.;Short term: Daily weights obtained and reported for increase. Utilizing diuretic protocols set by physician.;Long term: Adoption of self-care skills and reduction of barriers for early signs and symptoms recognition and intervention leading to self-care maintenance.    Lipids  Yes    Intervention  Provide education and support for participant on nutrition & aerobic/resistive exercise along with prescribed  medications to achieve LDL '70mg'$ , HDL >'40mg'$ .    Expected Outcomes  Short Term: Participant states understanding of desired cholesterol values and is compliant with medications prescribed. Participant is following exercise prescription and nutrition guidelines.;Long Term: Cholesterol controlled with medications as prescribed, with individualized exercise RX and with personalized nutrition plan. Value goals: LDL < '70mg'$ , HDL > 40 mg.        Personal Goals Discharge: Goals and Risk Factor Review - 09/22/17 0813      Core Components/Risk Factors/Patient Goals Review   Personal Goals Review  Weight Management/Obesity;Heart Failure;Lipids    Review  Brad Lee is doing well in rehab.  He is feeling better and his incision is feeling better.  He has been able to cough and sneeze without the need of his pillow.  It is starting to feel more normal and heal better.   Weight has been doing good and is down to 220 lbs here and holding steady, He has continued to work on diet and exercise.  He has not had any heart failure symptoms.  His meds are working for him.  He continues to have improved circulataion as he was not cold at the office last week.     Expected Outcomes  Short: Continue to work on weight loss.  Long: Continue to improve heart.        Exercise Goals and Review: Exercise Goals    Row Name 08/21/17 1446             Exercise Goals   Increase Physical Activity  Yes       Intervention  Provide advice, education, support and counseling about physical activity/exercise needs.;Develop an individualized exercise prescription for aerobic and resistive training based on initial evaluation findings, risk stratification, comorbidities and participant's personal goals.       Expected Outcomes  Short Term: Attend rehab on a regular basis to increase amount of physical activity.;Long Term: Add in home exercise to make exercise part of routine and to increase amount of physical activity.;Long Term:  Exercising regularly at least 3-5 days a week.       Increase Strength and Stamina  Yes       Intervention  Provide advice, education, support and counseling about physical activity/exercise needs.;Develop an individualized exercise prescription for aerobic and resistive training based on initial evaluation findings, risk stratification, comorbidities and participant's personal goals.       Expected Outcomes  Short Term: Increase workloads from initial exercise prescription for resistance, speed, and METs.;Short Term: Perform resistance training exercises routinely during rehab and add in resistance training at home;Long Term: Improve cardiorespiratory fitness, muscular endurance and strength as measured by increased METs and functional capacity (6MWT)       Able to understand and use rate of perceived exertion (RPE) scale  Yes       Intervention  Provide education and explanation on how to use RPE scale       Expected Outcomes  Short Term: Able to use RPE daily in rehab to express subjective intensity level;Long Term:  Able to use RPE to guide intensity level when exercising independently  Knowledge and understanding of Target Heart Rate Range (THRR)  Yes       Intervention  Provide education and explanation of THRR including how the numbers were predicted and where they are located for reference       Expected Outcomes  Short Term: Able to state/look up THRR;Long Term: Able to use THRR to govern intensity when exercising independently;Short Term: Able to use daily as guideline for intensity in rehab       Able to check pulse independently  Yes       Intervention  Provide education and demonstration on how to check pulse in carotid and radial arteries.;Review the importance of being able to check your own pulse for safety during independent exercise       Expected Outcomes  Short Term: Able to explain why pulse checking is important during independent exercise;Long Term: Able to check pulse  independently and accurately       Understanding of Exercise Prescription  Yes       Intervention  Provide education, explanation, and written materials on patient's individual exercise prescription       Expected Outcomes  Short Term: Able to explain program exercise prescription;Long Term: Able to explain home exercise prescription to exercise independently          Nutrition & Weight - Outcomes: Pre Biometrics - 08/21/17 1447      Pre Biometrics   Height  6' 1.6" (1.869 m)    Weight  219 lb 9.6 oz (99.6 kg)    Waist Circumference  40 inches    Hip Circumference  42 inches    Waist to Hip Ratio  0.95 %    BMI (Calculated)  28.52    Single Leg Stand  30 seconds      Post Biometrics - 10/11/17 0950       Post  Biometrics   Height  6' 1.6" (1.869 m)    Weight  221 lb 9.6 oz (100.5 kg)    Waist Circumference  37.5 inches    Hip Circumference  41 inches    Waist to Hip Ratio  0.91 %    BMI (Calculated)  28.78    Single Leg Stand  30 seconds       Nutrition: Nutrition Therapy & Goals - 09/04/17 0911      Nutrition Therapy   Diet  DASH/ TLC    Drug/Food Interactions  Statins/Certain Fruits    Protein (specify units)  13oz    Fiber  37 grams    Whole Grain Foods  4 servings    Saturated Fats  17 max. grams    Fruits and Vegetables  6 servings/day    Sodium  2000 grams      Personal Nutrition Goals   Nutrition Goal  Be a nutrition facts label reader! Identify foods and beverages high in sodium and fat    Personal Goal #2  Use salt-free flavorings such as citrus, ginger, dry herbs, low-salt dressings, and other salt-free seasonings    Personal Goal #3  Look for ways to reduce sodium when eating out. Manipulate portions, add protein/ vegetables, get dressings and sauces on the side, or swap sides    Comments  Patient and his wife have started to take steps to reduce dietary sodium intake, and have been cooking at home more.      Intervention Plan   Intervention  Nutrition  handout(s) given to patient.;Prescribe, educate and counsel regarding individualized specific dietary modifications aiming towards  targeted core components such as weight, hypertension, lipid management, diabetes, heart failure and other comorbidities. Following a low sodium diet    Expected Outcomes  Short Term Goal: Understand basic principles of dietary content, such as calories, fat, sodium, cholesterol and nutrients.;Short Term Goal: A plan has been developed with personal nutrition goals set during dietitian appointment.;Long Term Goal: Adherence to prescribed nutrition plan.       Nutrition Discharge: Nutrition Assessments - 08/21/17 1328      MEDFICTS Scores   Pre Score  6       Education Questionnaire Score: Knowledge Questionnaire Score - 08/21/17 1334      Knowledge Questionnaire Score   Pre Score  22/28 Reviewed correct responses with Jacki Cones. He verbalized understanding of the responses and had no further questions today.       Goals reviewed with patient; copy given to patient.

## 2017-11-01 ENCOUNTER — Telehealth: Payer: Self-pay | Admitting: *Deleted

## 2017-11-01 ENCOUNTER — Encounter: Payer: Self-pay | Admitting: *Deleted

## 2017-11-01 ENCOUNTER — Telehealth: Payer: Self-pay | Admitting: Interventional Cardiology

## 2017-11-01 DIAGNOSIS — I213 ST elevation (STEMI) myocardial infarction of unspecified site: Secondary | ICD-10-CM

## 2017-11-01 NOTE — Telephone Encounter (Signed)
Patient calling,  States that he dropped the crtical illness insurance forms and wanted to make you aware that one form was for the heart attack and the other form was for the bypass surgery.

## 2017-11-01 NOTE — Telephone Encounter (Signed)
Spoke with pt and he discovered he has critical illness insurance and is needing forms completed.  Advised pt to bring form to office and give to medical records.  Pt verbalized understanding and was appreciative for call.

## 2017-11-01 NOTE — Telephone Encounter (Signed)
Called to check on status of return.  He has been trying to submit his paperwork for insurance.  He is planning to return on Friday to graduate!

## 2017-11-01 NOTE — Telephone Encounter (Signed)
Patient states that he needs Dr. Katrinka BlazingSmith to sign his critical illness form and that he can provide more information when he speaks with you.

## 2017-11-01 NOTE — Telephone Encounter (Signed)
Paperwork dropped off by patient.

## 2017-11-01 NOTE — Telephone Encounter (Signed)
Will route to medical records to make them aware.

## 2017-11-06 ENCOUNTER — Encounter: Payer: Managed Care, Other (non HMO) | Admitting: *Deleted

## 2017-11-06 DIAGNOSIS — I213 ST elevation (STEMI) myocardial infarction of unspecified site: Secondary | ICD-10-CM

## 2017-11-06 NOTE — Progress Notes (Signed)
Daily Session Note  Patient Details  Name: Brad Lee MRN: [id number] Date of Birth: 04/06/1976 Referring Provider:     Cardiac Rehab from 08/21/2017 in [facility] Cardiac and Pulmonary Rehab  Referring Provider  Daneen Schick MD      Encounter Date: 11/06/2017  Check In: Session Check In - 11/06/17 0758      Check-In   Location  [facility]iac [facility] Rehab    Staff Present  Alberteen Sam, MA, RCEP, CCRP, Exercise Physiologist;Kelly Amedeo Plenty, BS, ACSM CEP, Exercise Physiologist;Susanne Bice, RN, BSN, CCRP    Supervising physician immediately available to respond to emergencies  See telemetry face sheet for immediately available ER MD    Medication changes reported      No    Fall or balance concerns reported     No    Warm-up and Cool-down  Performed on first and last piece of equipment    Resistance Training Performed  Yes    VAD Patient?  No      Pain Assessment   Currently in Pain?  No/denies          Social History   Tobacco Use  Smoking Status Never Smoker  Smokeless Tobacco Never Used    Goals Met:  Independence with exercise equipment Exercise tolerated well Personal goals reviewed No report of cardiac concerns or symptoms Strength training completed today  Goals Unmet:  Not Applicable  Comments:  Brad Lee graduated today from  rehab with 36 sessions completed.  Details of the patient's exercise prescription and what He needs to do in order to continue the prescription and progress were discussed with patient.  Patient was given a copy of prescription and goals.  Patient verbalized understanding.  Brad Lee plans to continue to exercise by walking, using treadmill at home, and joining [employer] or [employer].    Dr. Emily Filbert is Medical Director for [place] and [facility].

## 2017-11-06 NOTE — Progress Notes (Signed)
Cardiac Individual Treatment Plan  Patient Details  Name: Brad Lee MRN: [id number] Date of Birth: Jul 02, 1976 Referring Provider:     Cardiac Rehab from 08/21/2017 in [facility] Cardiac [facility] Pulmonary Rehab  Referring Provider  Daneen Schick MD      Initial Encounter Date:    Cardiac Rehab from 08/21/2017 in [facility] Cardiac and Pulmonary Rehab  Date  08/21/17  Referring Provider  Daneen Schick MD      Visit Diagnosis: ST elevation myocardial infarction (STEMI), unspecified artery ([place])  Patient's Home Medications on Admission:  Current Outpatient Medications:  .  albuterol (PROVENTIL HFA;VENTOLIN HFA) 108 (90 Base) MCG/ACT inhaler, Inhale 2 puffs into the lungs every 6 (six) hours as needed for wheezing or shortness of breath., Disp: , Rfl:  .  aspirin 81 MG chewable tablet, Chew 1 tablet (81 mg total) by mouth daily., Disp: 90 tablet, Rfl: 3 .  atorvastatin (LIPITOR) 40 MG tablet, Take 1 tablet (40 mg total) by mouth daily., Disp: 90 tablet, Rfl: 3 .  calcium carbonate (TUMS - DOSED IN MG ELEMENTAL CALCIUM) 500 MG chewable tablet, Chew 1 tablet by mouth daily as needed for indigestion or heartburn. , Disp: , Rfl:  .  cetirizine (ZYRTEC) 10 MG tablet, Take 10 mg by mouth daily as needed for allergies. , Disp: , Rfl:  .  famotidine (PEPCID) 10 MG tablet, Take 10 mg by mouth daily as needed for heartburn or indigestion. , Disp: , Rfl:  .  fluticasone (FLONASE) 50 MCG/ACT nasal spray, Place 1 spray into both nostrils daily as needed for allergies. , Disp: , Rfl:  .  metoprolol succinate (TOPROL-XL) 50 MG 24 hr tablet, Take 1 tablet (50 mg total) by mouth daily. Take with or immediately following a meal., Disp: 90 tablet, Rfl: 3 .  Multiple Vitamin (MULTIVITAMIN) tablet, Take 1 tablet by mouth daily., Disp: , Rfl:  .  sacubitril-valsartan (ENTRESTO) 24-26 MG, Take 1 tablet by mouth 2 (two) times daily., Disp: 60 tablet, Rfl: 6  Past Medical History: Past Medical History:  Diagnosis Date  . CAD  (coronary artery disease)    a. 07/2017 s/p Ant STEMI/Cath [facility] [place], [place]): LAD 100->attempt to open LAD failed, high grade dzs in LCX/RCA--->CABG x 5 (LIMA->LAD, VG->Diag, VG->OM, VG->PDA->RPL.  Marland Kitchen HFrEF (heart failure with reduced ejection fraction) ([place])    a. 07/29/2017 Echo: EF 45%; b. 08/06/2017 TEE: EF 30-35%.  . Ischemic cardiomyopathy    a. 07/29/2017 Echo: EF 45%; b. 08/06/2017 TEE: EF 30-35%-->LifeVest placed following CABG.  . LVH (left ventricular hypertrophy)   . Mitral regurgitation   . Respiratory failure, acute ([place])   . 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 Syncope     Tobacco Use: Social History   Tobacco Use  Smoking Status Never Smoker  Smokeless Tobacco Never Used    Labs: Recent Review Flowsheet Data    Labs for ITP Cardiac and Pulmonary Rehab Latest Ref Rng & Units 08/18/2017 08/25/2017   Cholestrol 100 - 199 mg/dL - 113   LDLCALC 0 - 99 mg/dL - 51   HDL >39 mg/dL - 27(L)   Trlycerides 0 - 149 mg/dL - 175(H)   Hemoglobin A1c 4.8 - 5.6 % - 5.8(H)   TCO2 22 - 32 mmol/L 21(L) -       Exercise Target Goals:    Exercise Program Goal: Individual exercise prescription set using results from initial 6 min walk test and THRR while considering  patient's activity barriers and safety.   Exercise Prescription  Goal: Initial exercise prescription builds to 30-45 minutes a day of aerobic activity, 2-3 days per week.  Home exercise guidelines will be given to patient during program as part of exercise prescription that the participant will acknowledge.  Activity Barriers & Risk Stratification: Activity Barriers & Cardiac Risk Stratification - 08/21/17 1327      Activity Barriers & Cardiac Risk Stratification   Activity Barriers  Other (comment);Deconditioning;Muscular Weakness    Comments  Sternal precautions    Cardiac Risk Stratification  High       6 Minute Walk: 6 Minute Walk    Row Name 08/21/17 1444 10/11/17 0949       6 Minute Walk   Phase  Initial  Discharge     Distance  1060 feet  2000 feet    Distance % Change  -  88.6 %    Distance Feet Change  -  940 ft    Walk Time  6 minutes  6 minutes    # of Rest Breaks  0  0    MPH  2  3.79    METS  4.5  6.44    RPE  12  12    VO2 Peak  15.73  22.56    Symptoms  No  No    Resting HR  87 bpm  85 bpm    Resting BP  156/80 worried about LifeVest firing during test  132/70    Resting Oxygen Saturation   99 %  -    Exercise Oxygen Saturation  during 6 min walk  100 %  -    Max Ex. HR  120 bpm  131 bpm    Max Ex. BP  146/64  164/80    2 Minute Post BP  134/66  -       Oxygen Initial Assessment:   Oxygen Re-Evaluation:   Oxygen Discharge (Final Oxygen Re-Evaluation):   Initial Exercise Prescription: Initial Exercise Prescription - 08/21/17 1400      Date of Initial Exercise RX and Referring Provider   Date  08/21/17    Referring Provider  Daneen Schick MD      Treadmill   MPH  2    Grade  1    Minutes  15    METs  2.81      REL-XR   Level  3    Speed  50    Minutes  15    METs  2.5      T5 Nustep   Level  3    SPM  80    Minutes  15    METs  2.5      Prescription Details   Frequency (times per week)  3    Duration  Progress to 45 minutes of aerobic exercise without signs/symptoms of physical distress      Intensity   THRR 40-80% of Max Heartrate  115-159    Ratings of Perceived Exertion  11-13    Perceived Dyspnea  0-4      Progression   Progression  Continue to progress workloads to maintain intensity without signs/symptoms of physical distress.      Resistance Training   Training Prescription  Yes    Weight  2 lbs    Reps  10-15       Perform Capillary Blood Glucose checks as needed.  Exercise Prescription Changes: Exercise Prescription Changes    Row Name 08/21/17 1300 08/23/17 1500 09/01/17 0800 09/05/17 1400 09/20/17 1500  Response to Exercise   Blood Pressure (Admit)  156/80  128/64  -  120/64  132/66   Blood Pressure (Exercise)  142/64  136/80  -   126/82  138/72   Blood Pressure (Exit)  134/66  118/74  -  102/70  110/56   Heart Rate (Admit)  87 bpm  118 bpm  -  95 bpm  58 bpm   Heart Rate (Exercise)  120 bpm  122 bpm  -  111 bpm  138 bpm   Heart Rate (Exit)  97 bpm  101 bpm  -  90 bpm  91 bpm   Oxygen Saturation (Admit)  99 %  -  -  -  -   Oxygen Saturation (Exercise)  100 %  -  -  -  -   Rating of Perceived Exertion (Exercise)  12  13  -  12  13   Perceived Dyspnea (Exercise)  -  -  -  -  -   Symptoms  none  none  -  none LifeVest continues to alarm despite no rhythm issues  none   Comments  walk test results  first full day of exercise  -  -  -   Duration  -  Progress to 45 minutes of aerobic exercise without signs/symptoms of physical distress  -  Continue with 45 min of aerobic exercise without signs/symptoms of physical distress.  Continue with 45 min of aerobic exercise without signs/symptoms of physical distress.   Intensity  -  THRR unchanged  -  THRR unchanged  THRR unchanged     Progression   Progression  -  Continue to progress workloads to maintain intensity without signs/symptoms of physical distress.  -  Continue to progress workloads to maintain intensity without signs/symptoms of physical distress.  Continue to progress workloads to maintain intensity without signs/symptoms of physical distress.   Average METs  -  2.46  -  3.17  7.4     Resistance Training   Training Prescription  -  Yes  -  Yes  Yes   Weight  -  2 lbs sternal precautions  -  5 lbs  5 lbs   Reps  -  10-15  -  10-15  10-15     Interval Training   Interval Training  -  No  -  No  Yes   Equipment  -  -  -  -  Treadmill;Biostep-RELP;REL-XR   Comments  -  -  -  -  64mn off 30sec on     Treadmill   MPH  -  2  -  3  3   Grade  -  1  -  1  8   Minutes  -  15  -  15  15   METs  -  2.81  -  3.71  6.6     REL-XR   Level  -  -  -  4  10 off at level4   Minutes  -  -  -  15  15   METs  -  -  -  3.3  11     T5 Nustep   Level  -  3  -  5  10 off at  level 5   Minutes  -  15  -  15  15   METs  -  2.1  -  2.5  4.5     Home Exercise Plan  Plans to continue exercise at  -  -  [facility] (comment) walking, ellipitical, rower, bike  Home (comment) walking, ellipitical, rower, bike  Home (comment) walking, ellipitical, rower, bike   Frequency  -  -  Add 3 additional days to program exercise sessions.  Add 3 additional days to program exercise sessions.  Add 3 additional days to program exercise sessions.   Initial Home Exercises Provided  -  -  09/01/17  09/01/17  09/01/17   Row Name 10/05/17 1100 10/17/17 1400           Response to Exercise   Blood Pressure (Admit)  128/62  128/60      Blood Pressure (Exercise)  142/76  134/60      Blood Pressure (Exit)  102/62  144/70      Heart Rate (Admit)  92 bpm  99 bpm      Heart Rate (Exercise)  138 bpm  130 bpm      Heart Rate (Exit)  89 bpm  96 bpm      Rating of Perceived Exertion (Exercise)  13  12.5      Symptoms  none  none      Duration  Continue with 45 min of aerobic exercise without signs/symptoms of physical distress.  Continue with 45 min of aerobic exercise without signs/symptoms of physical distress.      Intensity  THRR unchanged  THRR unchanged        Progression   Progression  Continue to progress workloads to maintain intensity without signs/symptoms of physical distress.  Continue to progress workloads to maintain intensity without signs/symptoms of physical distress.      Average METs  6.1  6.67        Resistance Training   Training Prescription  Yes  Yes      Weight  5 lbs  6 lbs      Reps  10-15  10-15        Interval Training   Interval Training  Yes  Yes      Equipment  Treadmill;REL-XR;T5 Nustep  Treadmill;REL-XR;T5 Nustep      Comments  67mn on 132m off  48m648mon 48mi3mff        Treadmill   MPH  3  3      Grade  8  8      Minutes  15  15      METs  6.6  6.6        REL-XR   Level  15  15      Minutes  15  15      METs  7.9  7.9        T5 Nustep   Level  10   10      Minutes  15  15      METs  4.7  5.5        Home Exercise Plan   Plans to continue exercise at  Home (comment) walking, ellipitical, rower, bike  Home (comment) walking, ellipitical, rower, bike      Frequency  Add 3 additional days to program exercise sessions.  Add 3 additional days to program exercise sessions.      Initial Home Exercises Provided  09/01/17  09/01/17         Exercise Comments: Exercise Comments    Row Name 08/23/17 0804 11/06/17 0800         Exercise Comments  First full day of exercise!  Patient  was oriented to gym and equipment including functions, settings, policies, and procedures.  Patient's individual exercise prescription and treatment plan were reviewed.  All starting workloads were established based on the results of the 6 minute walk test done at initial orientation visit.  The plan for exercise progression was also introduced and progression will be customized based on patient's performance and goals.   Matei graduated today from  rehab with 36 sessions completed.  Details of the patient's exercise prescription and what He needs to do in order to continue the prescription and progress were discussed with patient.  Patient was given a copy of prescription and goals.  Patient verbalized understanding.  Ulysse plans to continue to exercise by walking, using treadmill at home, and joining [employer] or [employer].         Exercise Goals and Review: Exercise Goals    Row Name 08/21/17 1446             Exercise Goals   Increase Physical Activity  Yes       Intervention  Provide advice, education, support and counseling about physical activity/exercise needs.;Develop an individualized exercise prescription for aerobic and resistive training based on initial evaluation findings, risk stratification, comorbidities and participant's personal goals.       Expected Outcomes  Short Term: Attend rehab on a regular basis to increase amount of physical  activity.;Long Term: Add in home exercise to make exercise part of routine and to increase amount of physical activity.;Long Term: Exercising regularly at least 3-5 days a week.       Increase Strength and Stamina  Yes       Intervention  Provide advice, education, support and counseling about physical activity/exercise needs.;Develop an individualized exercise prescription for aerobic and resistive training based on initial evaluation findings, risk stratification, comorbidities and participant's personal goals.       Expected Outcomes  Short Term: Increase workloads from initial exercise prescription for resistance, speed, and METs.;Short Term: Perform resistance training exercises routinely during rehab and add in resistance training at home;Long Term: Improve cardiorespiratory fitness, muscular endurance and strength as measured by increased METs and functional capacity (6MWT)       Able to understand and use rate of perceived exertion (RPE) scale  Yes       Intervention  Provide education and explanation on how to use RPE scale       Expected Outcomes  Short Term: Able to use RPE daily in rehab to express subjective intensity level;Long Term:  Able to use RPE to guide intensity level when exercising independently       Knowledge and understanding of Target Heart Rate Range (THRR)  Yes       Intervention  Provide education and explanation of THRR including how the numbers were predicted and where they are located for reference       Expected Outcomes  Short Term: Able to state/look up THRR;Long Term: Able to use THRR to govern intensity when exercising independently;Short Term: Able to use daily as guideline for intensity in rehab       Able to check pulse independently  Yes       Intervention  Provide education and demonstration on how to check pulse in carotid and radial arteries.;Review the importance of being able to check your own pulse for safety during independent exercise       Expected  Outcomes  Short Term: Able to explain why pulse checking is important during independent exercise;Long Term: Able to  check pulse independently and accurately       Understanding of Exercise Prescription  Yes       Intervention  Provide education, explanation, and written materials on patient's individual exercise prescription       Expected Outcomes  Short Term: Able to explain program exercise prescription;Long Term: Able to explain home exercise prescription to exercise independently          Exercise Goals Re-Evaluation : Exercise Goals Re-Evaluation    Row Name 08/23/17 0805 09/01/17 9323 09/05/17 1442 09/20/17 1514 09/22/17 0810     Exercise Goal Re-Evaluation   Exercise Goals Review  Understanding of Exercise Prescription;Able to understand and use Dyspnea scale;Knowledge and understanding of Target Heart Rate Range (THRR);Able to understand and use rate of perceived exertion (RPE) scale  Understanding of Exercise Prescription;Able to understand and use Dyspnea scale;Knowledge and understanding of Target Heart Rate Range (THRR);Able to understand and use rate of perceived exertion (RPE) scale;Increase Physical Activity;Increase Strength and Stamina;Able to check pulse independently  Increase Physical Activity;Understanding of Exercise Prescription;Increase Strength and Stamina  Increase Physical Activity;Understanding of Exercise Prescription;Increase Strength and Stamina  Increase Physical Activity;Understanding of Exercise Prescription;Increase Strength and Stamina   Comments  Reviewed RPE scale, THR and program prescription with pt today.  Pt voiced understanding and was given a copy of goals to take home.   Garnet is off to a good start in rehab.  He has already started to feel stronger and has more stamina.   He is already asking about what else he can do at home.  Reviewed home exercise with pt today.  Pt plans to use equipment he has at home for exercise.  Thurman has a rowing machine, a  bike, and a cross elliptical for use at home.  He also has some weights he can use at home as well.  Reviewed THR, pulse, RPE, sign and symptoms, NTG use, and when to call 911 or MD.  Also discussed weather considerations and indoor options.  Pt voiced understanding.  Billey is doing well in rehab.  He continues to feel better in rehab and at home.  He has started to add in his home exercise and glad to be moving again. He has moved up to 5 lbs weights this week without a problem.  He wants to be able to pick up his daughter by her first birthday next month.  We will continue to work towards this goal.  Adar continues to do well in rehab.  He is getting stronger and more confident in what he is able to do.  He has been holding his daughter more but has yet to pick her up from the ground.  We will continue to work with his on his progression.    Sabri is doing well in rehab.  He is enjoying exercise.  He has not started doing his home exercise as much.  He needs to clean out the exercise room.  He is going to go out more and take his daughter out some with him. He is feeling stronger and has more stamina. He notices improvements each week.  He is also working on intervals.    Expected Outcomes  Short: Use RPE daily to regulate intensity.  Long: Follow program prescription in THR.  Short: Continue to add in home exercise.  Long: Continue to work to build strength and stamina back.   Short: Continue to build upper body strength  Long: Able to pick up daughter.   Short: Continue  to buid confidence post surgery and returning to work.  Long: Continue to work towards picking up his daughter.   Short: Clean out exercise room and walk more. Long: Continue increase strength and stamina.    [place] Name 10/05/17 1042 10/17/17 1425 10/25/17 1342         Exercise Goal Re-Evaluation   Exercise Goals Review  Increase Physical Activity;Understanding of Exercise Prescription;Increase Strength and Stamina  Increase Physical  Activity;Understanding of Exercise Prescription;Increase Strength and Stamina  Increase Physical Activity;Understanding of Exercise Prescription;Increase Strength and Stamina     Comments  Harlo continues to do well in rehab.  He is really working hard on his intervals.  He jumps up to level 10 on T5 and level15 on XR for his intervlas.  We will continue to monitor his progression.   Brandan will be graduating on Monday!!  He has his follow up echo on Friday and is hoping for good news!  He continues to do his intervals and working hard.  He is feeling significantly stronger and is now able to hold his daughter and carry her around.  He has not tried to pick her up yet, but its not too far in the future.  He is pleased with the progress he had made.  He is already planning on joining a gym to keep going!  Timmothy Euler got good news from his doctor and his ejection fraction has recovered to 45-50%!!  He is planning to continue to exercise by joining a gym.  He is currently trying to talk with his insurance company to see if they would help cover a gym membership     Expected Outcomes  Short: Continue to work on balance of work, exercise, and life.  Long: Continue to increase strength and stamina to be able to pick up daughter.   Short: Graduate!  Long: Continue to exercise at home.  And be able to pick up his daughter.   Short: Graduate!  Long: Continue to exercise at home.  And be able to pick up his daughter.         Discharge Exercise Prescription (Final Exercise Prescription Changes): Exercise Prescription Changes - 10/17/17 1400      Response to Exercise   Blood Pressure (Admit)  128/60    Blood Pressure (Exercise)  134/60    Blood Pressure (Exit)  144/70    Heart Rate (Admit)  99 bpm    Heart Rate (Exercise)  130 bpm    Heart Rate (Exit)  96 bpm    Rating of Perceived Exertion (Exercise)  12.5    Symptoms  none    Duration  Continue with 45 min of aerobic exercise without signs/symptoms of  physical distress.    Intensity  THRR unchanged      Progression   Progression  Continue to progress workloads to maintain intensity without signs/symptoms of physical distress.    Average METs  6.67      Resistance Training   Training Prescription  Yes    Weight  6 lbs    Reps  10-15      Interval Training   Interval Training  Yes    Equipment  Treadmill;REL-XR;T5 Nustep    Comments  36mn on 155m off      Treadmill   MPH  3    Grade  8    Minutes  15    METs  6.6      REL-XR   Level  15    Minutes  15    METs  7.9      T5 Nustep   Level  10    Minutes  15    METs  5.5      Home Exercise Plan   Plans to continue exercise at  Home (comment) walking, ellipitical, rower, bike    Frequency  Add 3 additional days to program exercise sessions.    Initial Home Exercises Provided  09/01/17       Nutrition:  Target Goals: Understanding of nutrition guidelines, daily intake of sodium <151m, cholesterol <203m calories 30% from fat and 7% or less from saturated fats, daily to have 5 or more servings of fruits and vegetables.  Biometrics: Pre Biometrics - 08/21/17 1447      Pre Biometrics   Height  6' 1.6" (1.869 m)    Weight  219 lb 9.6 oz (99.6 kg)    Waist Circumference  40 inches    Hip Circumference  42 inches    Waist to Hip Ratio  0.95 %    BMI (Calculated)  28.52    Single Leg Stand  30 seconds      Post Biometrics - 10/11/17 0950       Post  Biometrics   Height  6' 1.6" (1.869 m)    Weight  221 lb 9.6 oz (100.5 kg)    Waist Circumference  37.5 inches    Hip Circumference  41 inches    Waist to Hip Ratio  0.91 %    BMI (Calculated)  28.78    Single Leg Stand  30 seconds       Nutrition Therapy Plan and Nutrition Goals: Nutrition Therapy & Goals - 09/04/17 0911      Nutrition Therapy   Diet  DASH/ TLC    Drug/Food Interactions  Statins/Certain Fruits    Protein (specify units)  13oz    Fiber  37 grams    Whole Grain Foods  4 servings     Saturated Fats  17 max. grams    Fruits and Vegetables  6 servings/day    Sodium  2000 grams      Personal Nutrition Goals   Nutrition Goal  Be a nutrition facts label reader! Identify foods and beverages high in sodium and fat    Personal Goal #2  Use salt-free flavorings such as citrus, ginger, dry herbs, low-salt dressings, and other salt-free seasonings    Personal Goal #3  Look for ways to reduce sodium when eating out. Manipulate portions, add protein/ vegetables, get dressings and sauces on the side, or swap sides    Comments  Patient and his wife have started to take steps to reduce dietary sodium intake, and have been cooking at home more.      Intervention Plan   Intervention  Nutrition handout(s) given to patient.;Prescribe, educate and counsel regarding individualized specific dietary modifications aiming towards targeted core components such as weight, hypertension, lipid management, diabetes, heart failure and other comorbidities. Following a low sodium diet    Expected Outcomes  Short Term Goal: Understand basic principles of dietary content, such as calories, fat, sodium, cholesterol and nutrients.;Short Term Goal: A plan has been developed with personal nutrition goals set during dietitian appointment.;Long Term Goal: Adherence to prescribed nutrition plan.       Nutrition Assessments: Nutrition Assessments - 08/21/17 1328      MEDFICTS Scores   Pre Score  6       Nutrition Goals Re-Evaluation: Nutrition Goals Re-Evaluation  [place] Name 09/01/17 0800 09/22/17 0818           Goals   Nutrition Goal  Meet with nutritionist with wife.    Be a nutrition facts label reader! Identify foods and beverages high in sodium and fat.  Salt free flavorings, eat out better, add protein      Comment  Helton is scheudled to meet with nutritionist on Monday with his wife to make sure he is doing what he should be.  He is looking forward to hearing her suggestions.  They have already  reduced sodium and trying to eat chicken and salamon mostly.   Perley has been reading his food labels frequently, even when he goes out to eat.  He was shocked at how much sodium was in everything.  He is getting more protein as this will help build muscle mass back up.  He has been eating more chicken and salmon.  He has not had as many bugers..  We talked about trying bison.  He has been eating more Mrs. Dash.        Expected Outcome  Short: Meet with dietician.  Long: Continue to follow recommendations.   Short: Continue to work sodium intake and eating better.  Long: Continue to eat heart healthy.          Nutrition Goals Discharge (Final Nutrition Goals Re-Evaluation): Nutrition Goals Re-Evaluation - 09/22/17 0818      Goals   Nutrition Goal  Be a nutrition facts label reader! Identify foods and beverages high in sodium and fat.  Salt free flavorings, eat out better, add protein    Comment  Paola has been reading his food labels frequently, even when he goes out to eat.  He was shocked at how much sodium was in everything.  He is getting more protein as this will help build muscle mass back up.  He has been eating more chicken and salmon.  He has not had as many bugers..  We talked about trying bison.  He has been eating more Mrs. Dash.      Expected Outcome  Short: Continue to work sodium intake and eating better.  Long: Continue to eat heart healthy.        Psychosocial: Target Goals: Acknowledge presence or absence of significant depression and/or stress, maximize coping skills, provide positive support system. Participant is able to verbalize types and ability to use techniques and skills needed for reducing stress and depression.   Initial Review & Psychosocial Screening: Initial Psych Review & Screening - 08/21/17 1331      Initial Review   Current issues with  Current Stress Concerns    Source of Stress Concerns  Unable to participate in former interests or hobbies;Unable to  perform yard/household activities;Chronic Illness;Occupation    Comments  Cartez is fearful of his LifeVest firing inappropriately.  He has had it alarm on multiple occasions when nothing was really going on.  He has had it refitted multiple times.   It is frustrating to him to deal with.  Currently he is unable to drive and is relying on his wife for transportation, who has also been out of work sicne his event.  They also have a 63 month old daughter, Judson Roch, that he wishes he could pick up and hold.  She is heavy handed and they were concerned about her hitting Daddy in the chest.  He really wants to be able to help care for his baby girl.  They take her to  [place] to stay with Mrs. Bulthuis mother during appointments.  They are planning to try to bring her to rehab to drop him off in the morning to see how that works for their schedule.  He is also worried about returning to work when it is time as he was keeping crazy hours working here and with his bosses in [place].  He was surviving on limited sleep (2-4 hrs) each night and working in a stressful job trying to prove himself since he was just rehired just before his daughter was born.  Previously, the company had laid him off for 6 months.  He is excited about starting the program and rebuidling his confidence and strength.       Family Dynamics   Good Support System?  Yes wife, family friends  pastor    Comments  Wife and family are very supportive. She flew out to [facility] to stay with him for a month during his hospitalization and recovery.        Barriers   Psychosocial barriers to participate in program  There are no identifiable barriers or psychosocial needs.;The patient should benefit from training in stress management and relaxation.      Screening Interventions   Interventions  Encouraged to exercise;To provide support and resources with identified psychosocial needs;Provide feedback about the scores to participant;Program counselor consult     Expected Outcomes  Short Term goal: Utilizing psychosocial counselor, staff and physician to assist with identification of specific Stressors or current issues interfering with healing process. Setting desired goal for each stressor or current issue identified.;Long Term Goal: Stressors or current issues are controlled or eliminated.;Short Term goal: Identification and review with participant of any Quality of Life or Depression concerns found by scoring the questionnaire.;Long Term goal: The participant improves quality of Life and PHQ9 Scores as seen by post scores and/or verbalization of changes       Quality of Life Scores:  Quality of Life - 08/21/17 1333      Quality of Life Scores   Health/Function Pre  24.86 %    Socioeconomic Pre  29.14 %    Psych/Spiritual Pre  30 %    Family Pre  30 %    GLOBAL Pre  27.64 %      Scores of 19 and below usually indicate a poorer quality of life in these areas.  A difference of  2-3 points is a clinically meaningful difference.  A difference of 2-3 points in the total score of the Quality of Life Index has been associated with significant improvement in overall quality of life, self-image, physical symptoms, and general health in studies assessing change in quality of life.  PHQ-9: Recent Review Flowsheet Data    Depression screen [facility] 2/9 08/21/2017   Decreased Interest 0   Down, Depressed, Hopeless 0   PHQ - 2 Score 0   Altered sleeping 0   Tired, decreased energy 1   Change in appetite 1   Feeling bad or failure about yourself  0   Trouble concentrating 0   Moving slowly or fidgety/restless 0   Suicidal thoughts 0   PHQ-9 Score 2   Difficult doing work/chores Not difficult at all     Interpretation of Total Score  Total Score Depression Severity:  1-4 = Minimal depression, 5-9 = Mild depression, 10-14 = Moderate depression, 15-19 = Moderately severe depression, 20-27 = Severe depression   Psychosocial Evaluation and  Intervention: Psychosocial Evaluation - 10/25/17 4503  Discharge Psychosocial Assessment & Intervention   Comments  Counselor met with Ranveer for discharge evaluation.  He has made a great deal of progress since coming into this program on many levels.  He just got the news that his ejection fraction has improved to a 45-50.  His stamina and strength have increased.  He has lost weight and is reaching his goal in this area.  He has gotten his driving privileges back and reports sleeping 6-8 hours per night now.  Russel has learned a great deal in the educational components of this program reporting nutrition and healthier eating have changed his habits and he has eliminated sodas and energy drinks altogether.  He has learned healthier ways to manage stress and is practicing better self-care with goals once he returns to work to have a better balance in work/home/life in general.  He has not returned to work yet as there has been some paperwork issues holding that up.  But hopes to be back soon with a better perspective and setting better boundaries and limits around his time.  Haseeb plans to work out at home with gym equipment or join the [employer] here at [facility] for maintenance.  Counselor commended Nurse, mental health for his commitment to this program and his positive self-care and healthy lifestyle changes.  He has been a pleasure to work with here in this program.         Psychosocial Re-Evaluation: Psychosocial Re-Evaluation    [place] Name 08/30/17 1018 09/06/17 0843 09/18/17 0927 09/20/17 1513 09/22/17 5929     Psychosocial Re-Evaluation   Current issues with  Current Stress Concerns  Current Stress Concerns  Current Stress Concerns  -  Current Stress Concerns   Comments  Counselor follow up with Jacki Cones stating he is enjoying this class and is feeling stronger already.  He continues to struggle with the life vest firing often, although he appears to be more relaxed about this.  He is reporting  less stress currently and better sleep with a recent change in medication.  counselor commended him for his consistency in exercise and commitment to improved health.    Raquel no longer has to wear his LifeVest.  They reviewed the records yesterday and found that it had been double counting his rate.  The rep contacted Dr. Tamala Julian and Dr. Caryl Comes to review results.  After discussion with physicians, Broady was released from having to wear the LifeVest.   He is so excited!!  We also talked about the progression of holding his daughter and wearing her now.    Counselor follow up with Jacki Cones today.  He reports feeling so much better since the LifeVest use was discontinued.  He is resting well and should see his Dr. today to release him for returning to work gradually.  Counselor discussed progress made with Andre reporting sleeping well; feeling stronger; more energy; getting back to more normal life activities and enjoying holding his new infant daughter.  Foch is cautious returning to work and plans to start with just one project and take it slow.  Counselor discussed a plan to prevent returning to the same level of stress that precipitated his cardiac event.  Amier is aware and is being proactive with this - even considering finding alternative employment if needed.  He plans to complete Cardiac Rehab end of April-early May.  Counselor encouraged Lawerance to incorporate a life coach if he has difficulty setting boundaries and limits with his employer.  Counselor commended Nurse, mental health for all  of his progress made and commitment to positive self care and being proactive in his plan to return to work gradually.    Tosh will be returning to work part time, which means full time but working from home.  He hopes to be able to continue in rehab at his current time, but he will let us know.   Camar will be returning to work on Monday part time at home.  He is worried about adding back in the long hours.  He is going  to be working on only one project to start and limit his time at meetings. He will still need to make sure that he takes time to himself to get his exercise.  He has been sleeping better.  He is planning to cut back on night calls and going to bed afterwards.  He is learning to level everything out.     Expected Outcomes  Marin will continue to exercise consistently for his health and mental health.  He will continue to take his medications which are helping him rest and refresh.  He will continue to use the LifeVest until the Dr. orders otherwise following his next Echo in the next month.  Staff will follow.    Short: Enjoy his new freedom.  Long: Continue to work toward being able to carry his daughter  Ewing will continue to practice positive self-care and set realistic expectations for his gradual return to work.  He will complete the Cardiac Rehab program.    -  Short: Manage going back to work and maintain self care. Long: Continue to remain postive and celebrate each goal met.    Interventions  Stress management education;Relaxation education  -  Stress management education  -  Encouraged to attend Cardiac Rehabilitation for the exercise   Continue Psychosocial Services   -  -  Follow up required by staff  -  Follow up required by staff     Initial Review   Source of Stress Concerns  -  -  -  -  Occupation;Unable to perform yard/household activities;Unable to participate in former interests or hobbies   [place] Name 10/02/17 [phone] 10/09/17 0831           Psychosocial Re-Evaluation   Current issues with  -  Current Stress Concerns      Comments  Counselor follow up with Jacki Cones today to see if had returned to work, but reported there has been a delay.  He continues to do well and is practicing better boundaries in his life to prevent the stress he experienced prior to his heart attack.  He is also working out more consistently and reports feeling stronger.  Counselor commended Herberto on the  progress made and his commitment to positive self-care.    Jonavon was able to carry his duaghter around this weekend!!  He was excited to be able to hold her while standing again. He is going to build up slowly!!      Expected Outcomes  Kyro will graduate into part-time work and has the capacity to work remotely from home initially.  He will continue to set healthy boundaries and limits and if his place of employment cannot respect those Jonluke is committed to looking elsewhere if needed.  Counselor commended Aveion on his progress made and self-care.  Short: Hold daughter.  Long: Pick up daughter from ground.       Interventions  Relaxation education;Stress management education  -      Continue Psychosocial  Services   Follow up required by staff  -         Psychosocial Discharge (Final Psychosocial Re-Evaluation): Psychosocial Re-Evaluation - 10/09/17 0831      Psychosocial Re-Evaluation   Current issues with  Current Stress Concerns    Comments  Keyron was able to carry his duaghter around this weekend!!  He was excited to be able to hold her while standing again. He is going to build up slowly!!    Expected Outcomes  Short: Hold daughter.  Long: Pick up daughter from ground.        Vocational Rehabilitation: Provide vocational rehab assistance to qualifying candidates.   Vocational Rehab Evaluation & Intervention: Vocational Rehab - 08/21/17 1335      Initial Vocational Rehab Evaluation & Intervention   Assessment shows need for Vocational Rehabilitation  No       Education: Education Goals: Education classes will be provided on a variety of topics geared toward better understanding of heart health and risk factor modification. Participant will state understanding/return demonstration of topics presented as noted by education test scores.  Learning Barriers/Preferences: Learning Barriers/Preferences - 08/21/17 1334      Learning Barriers/Preferences   Learning Barriers   None    Learning Preferences  Computer/Internet;Individual Instruction;Verbal Instruction;Video;Written Material       Education Topics:  AED/CPR: - Group verbal and written instruction with the use of models to demonstrate the basic use of the AED with the basic ABC's of resuscitation.   Cardiac Rehab from 11/06/2017 in [facility] Cardiac and Pulmonary Rehab  Date  08/28/17  Educator  SB  Instruction Review Code  1- Verbalizes Understanding      General Nutrition Guidelines/Fats and Fiber: -Group instruction provided by verbal, written material, models and posters to present the general guidelines for heart healthy nutrition. Gives an explanation and review of dietary fats and fiber.   Cardiac Rehab from 11/06/2017 in [facility] Cardiac and Pulmonary Rehab  Date  10/09/17  Educator  CR  Instruction Review Code  1- Verbalizes Understanding      Controlling Sodium/Reading Food Labels: -Group verbal and written material supporting the discussion of sodium use in heart healthy nutrition. Review and explanation with models, verbal and written materials for utilization of the food label.   Cardiac Rehab from 11/06/2017 in [facility] Cardiac and Pulmonary Rehab  Date  10/16/17  Educator  CR  Instruction Review Code  1- Verbalizes Understanding      Exercise Physiology & General Exercise Guidelines: - Group verbal and written instruction with models to review the exercise physiology of the cardiovascular system and associated critical values. Provides general exercise guidelines with specific guidelines to those with heart or lung disease.    Cardiac Rehab from 11/06/2017 in [facility] Cardiac and Pulmonary Rehab  Date  10/25/17  Educator  [facility]  Instruction Review Code  1- Verbalizes Understanding      Aerobic Exercise & Resistance Training: - Gives group verbal and written instruction on the various components of exercise. Focuses on aerobic and resistive training programs and the benefits of this training  and how to safely progress through these programs..   Flexibility, Balance, Mind/Body Relaxation: Provides group verbal/written instruction on the benefits of flexibility and balance training, including mind/body exercise modes such as yoga, pilates and tai chi.  Demonstration and skill practice provided.   Cardiac Rehab from 11/06/2017 in [facility] Cardiac and Pulmonary Rehab  Date  11/06/17  Educator  [facility]  Instruction Review Code  1- Verbalizes Understanding  Stress and Anxiety: - Provides group verbal and written instruction about the health risks of elevated stress and causes of high stress.  Discuss the correlation between heart/lung disease and anxiety and treatment options. Review healthy ways to manage with stress and anxiety.   Cardiac Rehab from 11/06/2017 in [facility] Cardiac and Pulmonary Rehab  Date  09/20/17  Educator  [facility]  Instruction Review Code  1- Verbalizes Understanding      Depression: - Provides group verbal and written instruction on the correlation between heart/lung disease and depressed mood, treatment options, and the stigmas associated with seeking treatment.   Cardiac Rehab from 11/06/2017 in [facility] Cardiac and Pulmonary Rehab  Date  09/06/17  Educator  [facility]  Instruction Review Code  1- Verbalizes Understanding      Anatomy & Physiology of the Heart: - Group verbal and written instruction and models provide basic cardiac anatomy and physiology, with the coronary electrical and arterial systems. Review of Valvular disease and Heart Failure   Cardiac Rehab from 11/06/2017 in [facility] Cardiac and Pulmonary Rehab  Date  10/02/17  Educator  SB  Instruction Review Code  1- Verbalizes Understanding      Cardiac Procedures: - Group verbal and written instruction to review commonly prescribed medications for heart disease. Reviews the medication, class of the drug, and side effects. Includes the steps to properly store meds and maintain the prescription regimen. (beta blockers  and nitrates)   Cardiac Rehab from 11/06/2017 in [facility] Cardiac and Pulmonary Rehab  Date  10/11/17  Educator  SB  Instruction Review Code  1- Verbalizes Understanding      Cardiac Medications I: - Group verbal and written instruction to review commonly prescribed medications for heart disease. Reviews the medication, class of the drug, and side effects. Includes the steps to properly store meds and maintain the prescription regimen.   Cardiac Rehab from 11/06/2017 in [facility] Cardiac and Pulmonary Rehab  Date  09/25/17  Educator  SB  Instruction Review Code  1- Verbalizes Understanding      Cardiac Medications II: -Group verbal and written instruction to review commonly prescribed medications for heart disease. Reviews the medication, class of the drug, and side effects. (all other drug classes)   Cardiac Rehab from 11/06/2017 in [facility] Cardiac and Pulmonary Rehab  Date  09/18/17  Educator  [facility]  Instruction Review Code  1- Verbalizes Understanding       Go Sex-Intimacy & Heart Disease, Get SMART - Goal Setting: - Group verbal and written instruction through game format to discuss heart disease and the return to sexual intimacy. Provides group verbal and written material to discuss and apply goal setting through the application of the S.M.A.R.T. Method.   Cardiac Rehab from 11/06/2017 in [facility] Cardiac and Pulmonary Rehab  Date  10/11/17  Educator  SB  Instruction Review Code  1- Verbalizes Understanding      Other Matters of the Heart: - Provides group verbal, written materials and models to describe Stable Angina and Peripheral Artery. Includes description of the disease process and treatment options available to the cardiac patient.   Cardiac Rehab from 11/06/2017 in [facility] Cardiac and Pulmonary Rehab  Date  10/02/17  Educator  SB  Instruction Review Code  1- Verbalizes Understanding      Exercise & Equipment Safety: - Individual verbal instruction and demonstration of equipment use  and safety with use of the equipment.   Cardiac Rehab from 11/06/2017 in [facility] Cardiac and Pulmonary Rehab  Date  08/21/17  Educator  [facility]  Instruction Review Code  1- Verbalizes Understanding      Infection Prevention: - Provides verbal and written material to individual with discussion of infection control including proper hand washing and proper equipment cleaning during exercise session.   Cardiac Rehab from 11/06/2017 in [facility] Cardiac and Pulmonary Rehab  Date  08/21/17  Educator  [facility]  Instruction Review Code  1- Verbalizes Understanding      Falls Prevention: - Provides verbal and written material to individual with discussion of falls prevention and safety.   Cardiac Rehab from 11/06/2017 in [facility] Cardiac and Pulmonary Rehab  Date  08/21/17  Educator  [facility]  Instruction Review Code  1- Verbalizes Understanding      Diabetes: - Individual verbal and written instruction to review signs/symptoms of diabetes, desired ranges of glucose level fasting, after meals and with exercise. Acknowledge that pre and post exercise glucose checks will be done for 3 sessions at entry of program.   Know Your Numbers and Risk Factors: -Group verbal and written instruction about important numbers in your health.  Discussion of what are risk factors and how they play a role in the disease process.  Review of Cholesterol, Blood Pressure, Diabetes, and BMI and the role they play in your overall health.   Cardiac Rehab from 11/06/2017 in [facility] Cardiac and Pulmonary Rehab  Date  09/18/17  Educator  [facility]  Instruction Review Code  1- Verbalizes Understanding      Sleep Hygiene: -Provides group verbal and written instruction about how sleep can affect your health.  Define sleep hygiene, discuss sleep cycles and impact of sleep habits. Review good sleep hygiene tips.    Cardiac Rehab from 11/06/2017 in [facility] Cardiac and Pulmonary Rehab  Date  10/04/17  Educator  [facility]  Instruction Review Code  1- Verbalizes  Understanding      Other: -Provides group and verbal instruction on various topics (see comments)   Knowledge Questionnaire Score: Knowledge Questionnaire Score - 08/21/17 1334      Knowledge Questionnaire Score   Pre Score  22/28 Reviewed correct responses with Jacki Cones. He verbalized understanding of the responses and had no further questions today.       Core Components/Risk Factors/Patient Goals at Admission: Personal Goals and Risk Factors at Admission - 08/21/17 1329      Core Components/Risk Factors/Patient Goals on Admission    Weight Management  Yes;Weight Maintenance    Intervention  Weight Management: Develop a combined nutrition and exercise program designed to reach desired caloric intake, while maintaining appropriate intake of nutrient and fiber, sodium and fats, and appropriate energy expenditure required for the weight goal.;Weight Management: Provide education and appropriate resources to help participant work on and attain dietary goals. Loss about 20 pounds while in hospital and at discharge.  210 is goal.     Admit Weight  219 lb 9.6 oz (99.6 kg)    Goal Weight: Short Term  217 lb (98.4 kg)    Goal Weight: Long Term  210 lb (95.3 kg)    Expected Outcomes  Short Term: Continue to assess and modify interventions until short term weight is achieved;Long Term: Adherence to nutrition and physical activity/exercise program aimed toward attainment of established weight goal    Heart Failure  Yes Wears a Life Vest.  Next echo in a few months    Intervention  Provide a combined exercise and nutrition program that is supplemented with education, support and counseling about heart failure. Directed toward relieving symptoms  such as shortness of breath, decreased exercise tolerance, and extremity edema.    Expected Outcomes  Improve functional capacity of life;Short term: Attendance in program 2-3 days a week with increased exercise capacity. Reported lower sodium intake. Reported  increased fruit and vegetable intake. Reports medication compliance.;Short term: Daily weights obtained and reported for increase. Utilizing diuretic protocols set by physician.;Long term: Adoption of self-care skills and reduction of barriers for early signs and symptoms recognition and intervention leading to self-care maintenance.    Lipids  Yes    Intervention  Provide education and support for participant on nutrition & aerobic/resistive exercise along with prescribed medications to achieve LDL <10m, HDL >468m    Expected Outcomes  Short Term: Participant states understanding of desired cholesterol values and is compliant with medications prescribed. Participant is following exercise prescription and nutrition guidelines.;Long Term: Cholesterol controlled with medications as prescribed, with individualized exercise RX and with personalized nutrition plan. Value goals: LDL < 7021mHDL > 40 mg.       Core Components/Risk Factors/Patient Goals Review:  Goals and Risk Factor Review    Row Name 09/01/17 0755 09/22/17 0813           Core Components/Risk Factors/Patient Goals Review   Personal Goals Review  Weight Management/Obesity;Heart Failure;Lipids  Weight Management/Obesity;Heart Failure;Lipids      Review  JarRoran off to a good start in rehab.  He is already starting to lose weight.  When he is at home he is down to 213 lbs and dressed at 221 lbs.  His goal was to get down to 210 lbs so he is well on his way.  He is sticking with his diet and exercising regular.    So now he wants to work on getting his muscle mass back.  He has not had any heart failure sysmptoms and has been weighing daily.  His legs have stopped tingling overall.    His circulation is improving and he is not as cold.  He is learning to appreciate the step and make smalll celebrations.   Meds seem to be working for him.   JarJarry doing well in rehab.  He is feeling better and his incision is feeling better.  He has  been able to cough and sneeze without the need of his pillow.  It is starting to feel more normal and heal better.   Weight has been doing good and is down to 220 lbs here and holding steady, He has continued to work on diet and exercise.  He has not had any heart failure symptoms.  His meds are working for him.  He continues to have improved circulataion as he was not cold at the office last week.       Expected Outcomes  Short: Continue to work on weight loss and buidling muscle mass.  Long: Continue to work to improve heart function.   Short: Continue to work on weight loss.  Long: Continue to improve heart.          Core Components/Risk Factors/Patient Goals at Discharge (Final Review):  Goals and Risk Factor Review - 09/22/17 0813      Core Components/Risk Factors/Patient Goals Review   Personal Goals Review  Weight Management/Obesity;Heart Failure;Lipids    Review  JarGrayer doing well in rehab.  He is feeling better and his incision is feeling better.  He has been able to cough and sneeze without the need of his pillow.  It is starting to feel more normal  and heal better.   Weight has been doing good and is down to 220 lbs here and holding steady, He has continued to work on diet and exercise.  He has not had any heart failure symptoms.  His meds are working for him.  He continues to have improved circulataion as he was not cold at the office last week.     Expected Outcomes  Short: Continue to work on weight loss.  Long: Continue to improve heart.        ITP Comments: ITP Comments    Row Name 08/21/17 1321 08/28/17 0805 09/06/17 0841 09/13/17 0650 10/09/17 0830   ITP Comments  Medical review completed  ITP sent to Dr Sabra Heck for review , changes as needed and signature.  Documentation can be found in [employer] Office Note from [employer] 0/28/2019  Called company about [place], they were not concerned.  He is going to try to call the local rep this week to see if someone could come  look at it again.   Kaylin no longer has to wear his LifeVest.  They reviewed the records yesterday and found that it had been double counting his rate.  The rep contacted Dr. Tamala Julian and Dr. Caryl Comes to review results.  After discussion with physicians, Thuan was released from having to wear the LifeVest.   30 day review completed. Continue with ITP unless diercted changes per Medical Director.  OUt this past week due to family members with the flu  Elzy was able to carry his duaghter around this weekend!!   Taylor Name 10/11/17 0619 11/01/17 1500 11/06/17 0800       ITP Comments  30 Day review. Continue with ITP unless directed changes per Medical Director review.    Called to check on status of return to rehab.  He has been going to the gym on his own.  His insurance doesn't cover the program at 100%.  He has [employer] research officer, government and would prefer to just use that since it is free.  We will discharge him from the program at this time.   Discharge ITP sent and signed by Dr. Sabra Heck.  Discharge Summary routed to PCP and cardiologist.        Comments: Discharge ITP

## 2017-11-06 NOTE — Progress Notes (Signed)
Discharge Progress Report  Patient Details  Name: Brad Lee MRN: [id number] Date of Birth: [birth year] Referring Provider:     Cardiac Rehab from 08/21/2017 in [facility]  Referring Provider  Daneen Schick MD       Number of Visits: 36/36  Reason for Discharge:  Patient reached a stable level of exercise. Patient independent in their exercise. Patient has met program and personal goals.  Smoking History:  Social History   Tobacco Use  Smoking Status Never Smoker  Smokeless Tobacco Never Used    Diagnosis:  ST elevation myocardial infarction (STEMI), unspecified artery ([place])  ADL [facility]:   Initial Exercise Prescription: Initial Exercise Prescription - 08/21/17 1400      Date of Initial Exercise RX and Referring Provider   Date  08/21/17    Referring Provider  Daneen Schick MD      Treadmill   MPH  2    Grade  1    Minutes  15    METs  2.81      REL-XR   Level  3    Speed  50    Minutes  15    METs  2.5      T5 Nustep   Level  3    SPM  80    Minutes  15    METs  2.5      Prescription Details   Frequency (times per week)  3    Duration  Progress to 45 minutes of aerobic exercise without signs/symptoms of physical distress      Intensity   THRR 40-80% of Max Heartrate  115-159    Ratings of Perceived Exertion  11-13    Perceived Dyspnea  0-4      Progression   Progression  Continue to progress workloads to maintain intensity without signs/symptoms of physical distress.      Resistance Training   Training Prescription  Yes    Weight  2 lbs    Reps  10-15       Discharge Exercise Prescription (Final Exercise Prescription Changes): Exercise Prescription Changes - 10/17/17 1400      Response to Exercise   Blood Pressure (Admit)  128/60    Blood Pressure (Exercise)  134/60    Blood Pressure (Exit)  144/70    Heart Rate (Admit)  99 bpm    Heart Rate (Exercise)  130 bpm    Heart Rate (Exit)  96 bpm    Rating of Perceived  Exertion (Exercise)  12.5    Symptoms  none    Duration  Continue with 45 min of aerobic exercise without signs/symptoms of physical distress.    Intensity  THRR unchanged      Progression   Progression  Continue to progress workloads to maintain intensity without signs/symptoms of physical distress.    Average METs  6.67      Resistance Training   Training Prescription  Yes    Weight  6 lbs    Reps  10-15      Interval Training   Interval Training  Yes    Equipment  Treadmill;REL-XR;T5 Nustep    Comments  30mn on 15m off      Treadmill   MPH  3    Grade  8    Minutes  15    METs  6.6      REL-XR   Level  15    Minutes  15    METs  7.9  T5 Nustep   Level  10    Minutes  15    METs  5.5      Home Exercise Plan   Plans to continue exercise at  Home (comment) walking, ellipitical, rower, bike    Frequency  Add 3 additional days to program exercise sessions.    Initial Home Exercises Provided  09/01/17       Functional Capacity: 6 Minute Walk    Row Name 08/21/17 1444 10/11/17 0949       6 Minute Walk   Phase  Initial  Discharge    Distance  1060 feet  2000 feet    Distance % Change  -  88.6 %    Distance Feet Change  -  940 ft    Walk Time  6 minutes  6 minutes    # of Rest Breaks  0  0    MPH  2  3.79    METS  4.5  6.44    RPE  12  12    VO2 Peak  15.73  22.56    Symptoms  No  No    Resting HR  87 bpm  85 bpm    Resting BP  156/80 worried about LifeVest firing during test  132/70    Resting Oxygen Saturation   99 %  -    Exercise Oxygen Saturation  during 6 min walk  100 %  -    Max Ex. HR  120 bpm  131 bpm    Max Ex. BP  146/64  164/80    2 Minute Post BP  134/66  -       Psychological, QOL, Others - Outcomes: PHQ 2/9: Depression screen PHQ 2/9 08/21/2017  Decreased Interest 0  Down, Depressed, Hopeless 0  PHQ - 2 Score 0  Altered sleeping 0  Tired, decreased energy 1  Change in appetite 1  Feeling bad or failure about yourself  0   Trouble concentrating 0  Moving slowly or fidgety/restless 0  Suicidal thoughts 0  PHQ-9 Score 2  Difficult doing work/chores Not difficult at all    Quality of Life: Quality of Life - 08/21/17 1333      Quality of Life Scores   Health/Function Pre  24.86 %    Socioeconomic Pre  29.14 %    Psych/Spiritual Pre  30 %    Family Pre  30 %    GLOBAL Pre  27.64 %       Personal Goals: Goals established at orientation with interventions provided to work toward goal. Personal Goals and Risk Factors at Admission - 08/21/17 1329      Core Components/Risk Factors/Patient Goals on Admission    Weight Management  Yes;Weight Maintenance    Intervention  Weight Management: Develop a combined nutrition and exercise program designed to reach desired caloric intake, while maintaining appropriate intake of nutrient and fiber, sodium and fats, and appropriate energy expenditure required for the weight goal.;Weight Management: Provide education and appropriate resources to help participant work on and attain dietary goals. Loss about 20 pounds while in hospital and at discharge.  210 is goal.     Admit Weight  219 lb 9.6 oz (99.6 kg)    Goal Weight: Short Term  217 lb (98.4 kg)    Goal Weight: Long Term  210 lb (95.3 kg)    Expected Outcomes  Short Term: Continue to assess and modify interventions until short term weight is achieved;Long Term: Adherence  to nutrition and physical activity/exercise program aimed toward attainment of established weight goal    Heart Failure  Yes Wears a Life Vest.  Next echo in a few months    Intervention  Provide a combined exercise and nutrition program that is supplemented with education, support and counseling about heart failure. Directed toward relieving symptoms such as shortness of breath, decreased exercise tolerance, and extremity edema.    Expected Outcomes  Improve functional capacity of life;Short term: Attendance in program 2-3 days a week with increased  exercise capacity. Reported lower sodium intake. Reported increased fruit and vegetable intake. Reports medication compliance.;Short term: Daily weights obtained and reported for increase. Utilizing diuretic protocols set by physician.;Long term: Adoption of self-care skills and reduction of barriers for early signs and symptoms recognition and intervention leading to self-care maintenance.    Lipids  Yes    Intervention  Provide education and support for participant on nutrition & aerobic/resistive exercise along with prescribed medications to achieve LDL <62m, HDL >44m    Expected Outcomes  Short Term: Participant states understanding of desired cholesterol values and is compliant with medications prescribed. Participant is following exercise prescription and nutrition guidelines.;Long Term: Cholesterol controlled with medications as prescribed, with individualized exercise RX and with personalized nutrition plan. Value goals: LDL < 7053mHDL > 40 mg.        Personal Goals Discharge: Goals and Risk Factor Review    Row Name 09/01/17 0755 09/22/17 0813           Core Components/Risk Factors/Patient Goals Review   Personal Goals Review  Weight Management/Obesity;Heart Failure;Lipids  Weight Management/Obesity;Heart Failure;Lipids      Review  JarLoi off to a good start in rehab.  He is already starting to lose weight.  When he is at home he is down to 213 lbs and dressed at 221 lbs.  His goal was to get down to 210 lbs so he is well on his way.  He is sticking with his diet and exercising regular.    So now he wants to work on getting his muscle mass back.  He has not had any heart failure sysmptoms and has been weighing daily.  His legs have stopped tingling overall.    His circulation is improving and he is not as cold.  He is learning to appreciate the step and make smalll celebrations.   Meds seem to be working for him.   JarRien doing well in rehab.  He is feeling better and his  incision is feeling better.  He has been able to cough and sneeze without the need of his pillow.  It is starting to feel more normal and heal better.   Weight has been doing good and is down to 220 lbs here and holding steady, He has continued to work on diet and exercise.  He has not had any heart failure symptoms.  His meds are working for him.  He continues to have improved circulataion as he was not cold at the office last week.       Expected Outcomes  Short: Continue to work on weight loss and buidling muscle mass.  Long: Continue to work to improve heart function.   Short: Continue to work on weight loss.  Long: Continue to improve heart.          Exercise Goals and Review: Exercise Goals    Row Name 08/21/17 1446  Exercise Goals   Increase Physical Activity  Yes       Intervention  Provide advice, education, support and counseling about physical activity/exercise needs.;Develop an individualized exercise prescription for aerobic and resistive training based on initial evaluation findings, risk stratification, comorbidities and participant's personal goals.       Expected Outcomes  Short Term: Attend rehab on a regular basis to increase amount of physical activity.;Long Term: Add in home exercise to make exercise part of routine and to increase amount of physical activity.;Long Term: Exercising regularly at least 3-5 days a week.       Increase Strength and Stamina  Yes       Intervention  Provide advice, education, support and counseling about physical activity/exercise needs.;Develop an individualized exercise prescription for aerobic and resistive training based on initial evaluation findings, risk stratification, comorbidities and participant's personal goals.       Expected Outcomes  Short Term: Increase workloads from initial exercise prescription for resistance, speed, and METs.;Short Term: Perform resistance training exercises routinely during rehab and add in resistance  training at home;Long Term: Improve cardiorespiratory fitness, muscular endurance and strength as measured by increased METs and functional capacity (6MWT)       Able to understand and use rate of perceived exertion (RPE) scale  Yes       Intervention  Provide education and explanation on how to use RPE scale       Expected Outcomes  Short Term: Able to use RPE daily in rehab to express subjective intensity level;Long Term:  Able to use RPE to guide intensity level when exercising independently       Knowledge and understanding of Target Heart Rate Range (THRR)  Yes       Intervention  Provide education and explanation of THRR including how the numbers were predicted and where they are located for reference       Expected Outcomes  Short Term: Able to state/look up THRR;Long Term: Able to use THRR to govern intensity when exercising independently;Short Term: Able to use daily as guideline for intensity in rehab       Able to check pulse independently  Yes       Intervention  Provide education and demonstration on how to check pulse in carotid and radial arteries.;Review the importance of being able to check your own pulse for safety during independent exercise       Expected Outcomes  Short Term: Able to explain why pulse checking is important during independent exercise;Long Term: Able to check pulse independently and accurately       Understanding of Exercise Prescription  Yes       Intervention  Provide education, explanation, and written materials on patient's individual exercise prescription       Expected Outcomes  Short Term: Able to explain program exercise prescription;Long Term: Able to explain home exercise prescription to exercise independently          Nutrition & Weight - Outcomes: Pre Biometrics - 08/21/17 1447      Pre Biometrics   Height  6' 1.6" (1.869 m)    Weight  219 lb 9.6 oz (99.6 kg)    Waist Circumference  40 inches    Hip Circumference  42 inches    Waist to Hip  Ratio  0.95 %    BMI (Calculated)  28.52    Single Leg Stand  30 seconds      Post Biometrics - 10/11/17 3710  Post  Biometrics   Height  6' 1.6" (1.869 m)    Weight  221 lb 9.6 oz (100.5 kg)    Waist Circumference  37.5 inches    Hip Circumference  41 inches    Waist to Hip Ratio  0.91 %    BMI (Calculated)  28.78    Single Leg Stand  30 seconds       Nutrition: Nutrition Therapy & Goals - 09/04/17 0911      Nutrition Therapy   Diet  DASH/ TLC    Drug/Food Interactions  Statins/Certain Fruits    Protein (specify units)  13oz    Fiber  37 grams    Whole Grain Foods  4 servings    Saturated Fats  17 max. grams    Fruits and Vegetables  6 servings/day    Sodium  2000 grams      Personal Nutrition Goals   Nutrition Goal  Be a nutrition facts label reader! Identify foods and beverages high in sodium and fat    Personal Goal #2  Use salt-free flavorings such as citrus, ginger, dry herbs, low-salt dressings, and other salt-free seasonings    Personal Goal #3  Look for ways to reduce sodium when eating out. Manipulate portions, add protein/ vegetables, get dressings and sauces on the side, or swap sides    Comments  Patient and his wife have started to take steps to reduce dietary sodium intake, and have been cooking at home more.      Intervention Plan   Intervention  Nutrition handout(s) given to patient.;Prescribe, educate and counsel regarding individualized specific dietary modifications aiming towards targeted core components such as weight, hypertension, lipid management, diabetes, heart failure and other comorbidities. Following a low sodium diet    Expected Outcomes  Short Term Goal: Understand basic principles of dietary content, such as calories, fat, sodium, cholesterol and nutrients.;Short Term Goal: A plan has been developed with personal nutrition goals set during dietitian appointment.;Long Term Goal: Adherence to prescribed nutrition plan.       Nutrition  Discharge: Nutrition Assessments - 08/21/17 1328      MEDFICTS Scores   Pre Score  6       Education Questionnaire Score: Knowledge Questionnaire Score - 08/21/17 1334      Knowledge Questionnaire Score   Pre Score  22/28 Reviewed correct responses with Jacki Cones. He verbalized understanding of the responses and had no further questions today.       Goals reviewed with patient; copy given to patient.

## 2017-11-08 ENCOUNTER — Telehealth: Payer: Self-pay | Admitting: Interventional Cardiology

## 2017-11-08 NOTE — Telephone Encounter (Signed)
New Message: ° ° ° ° °Pt is calling to speak with RN °

## 2017-11-08 NOTE — Telephone Encounter (Signed)
Pt planning to schedule dental work soon.  He knows he has a couple of cavities that will need to be filled.  Pt wanting to know if he needs antibiotics prior to dental work.  Advised I will speak with Dr. Katrinka BlazingSmith about this.  Pt appreciative for call.

## 2017-11-08 NOTE — Telephone Encounter (Signed)
Spoke with Dr. Katrinka BlazingSmith and he said pt did not need abts prior to dental work.  Spoke with pt and made him aware. Pt appreciative for call.

## 2017-11-15 ENCOUNTER — Encounter: Payer: Self-pay | Admitting: Interventional Cardiology

## 2017-11-15 ENCOUNTER — Ambulatory Visit (INDEPENDENT_AMBULATORY_CARE_PROVIDER_SITE_OTHER): Payer: Managed Care, Other (non HMO) | Admitting: Interventional Cardiology

## 2017-11-15 VITALS — BP 100/62 | HR 81 | Ht 74.0 in | Wt 219.8 lb

## 2017-11-15 DIAGNOSIS — I255 Ischemic cardiomyopathy: Secondary | ICD-10-CM

## 2017-11-15 DIAGNOSIS — I25709 Atherosclerosis of coronary artery bypass graft(s), unspecified, with unspecified angina pectoris: Secondary | ICD-10-CM

## 2017-11-15 DIAGNOSIS — E785 Hyperlipidemia, unspecified: Secondary | ICD-10-CM

## 2017-11-15 DIAGNOSIS — I5042 Chronic combined systolic (congestive) and diastolic (congestive) heart failure: Secondary | ICD-10-CM

## 2017-11-15 NOTE — Patient Instructions (Signed)

## 2017-11-15 NOTE — Progress Notes (Signed)
Cardiology Office Note    Date:  11/15/2017   ID:  Brad Lee, DOB [birth year], MRN [id number]  PCP:  Henrine Screws, MD  Cardiologist: Lesleigh Noe, MD   Chief Complaint  Patient presents with  . Coronary Artery Disease  . Congestive Heart Failure    History of Present Illness:  Brad Lee is a 42 y.o. male with no prior history of coronary disease who developed acute coronary syndrome July 25, 2017 in [place] .  After suffering an acute infarction, he underwent successful 5 vessel coronary bypass grafting with LIMA to LAD, SVG to diagonal, SVG to OM, sequential SVG to PDA and PLA.  Transesophageal echo several days after surgery demonstrated LVEF 30-35%.  Syncopal episode occurring upon returning to [facility] on 08/21/2017.  Current EF based upon echocardiogram April fifth 2019 was 40 to 45%.   Brad Lee has completed cardiac rehab.  He has recuperated nicely from a recent myocardial infarction.  He denies angina.  No shortness of breath or orthopnea.  When he awakens in the morning she has some soreness in his left shoulder.  This is not precipitated by physical activity and goes away with position change and movement.  No medication side effects that he is able to ascertain.  Denies lightheadedness and syncope.     Past Medical History:  Diagnosis Date  . CAD (coronary artery disease)    a. 07/2017 s/p Ant STEMI/Cath [facility], [place]): LAD 100->attempt to open LAD failed, high grade dzs in LCX/RCA--->CABG x 5 (LIMA->LAD, VG->Diag, VG->OM, VG->PDA->RPL.  Marland Kitchen HFrEF (heart failure with reduced ejection fraction) (HCC)    a. 07/29/2017 Echo: EF 45%; b. 08/06/2017 TEE: EF 30-35%.  . Ischemic cardiomyopathy    a. 07/29/2017 Echo: EF 45%; b. 08/06/2017 TEE: EF 30-35%-->LifeVest placed following CABG.  . LVH (left ventricular hypertrophy)   . Mitral regurgitation   . Respiratory failure, acute (HCC)   . 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 Syncope     Past Surgical History:    Procedure Laterality Date  . CORONARY ARTERY BYPASS GRAFT  07/28/2017  . TONSILLECTOMY      Current Medications: Outpatient Medications Prior to Visit  Medication Sig Dispense Refill  . albuterol (PROVENTIL HFA;VENTOLIN HFA) 108 (90 Base) MCG/ACT inhaler Inhale 2 puffs into the lungs every 6 (six) hours as needed for wheezing or shortness of breath.    Marland Kitchen aspirin 81 MG chewable tablet Chew 1 tablet (81 mg total) by mouth daily. 90 tablet 3  . atorvastatin (LIPITOR) 40 MG tablet Take 1 tablet (40 mg total) by mouth daily. 90 tablet 3  . calcium carbonate (TUMS - DOSED IN MG ELEMENTAL CALCIUM) 500 MG chewable tablet Chew 1 tablet by mouth daily as needed for indigestion or heartburn.     . cetirizine (ZYRTEC) 10 MG tablet Take 10 mg by mouth daily as needed for allergies.     . famotidine (PEPCID) 10 MG tablet Take 10 mg by mouth daily as needed for heartburn or indigestion.     . fluticasone (FLONASE) 50 MCG/ACT nasal spray Place 1 spray into both nostrils daily as needed for allergies.     . metoprolol succinate (TOPROL-XL) 50 MG 24 hr tablet Take 1 tablet (50 mg total) by mouth daily. Take with or immediately following a meal. 90 tablet 3  . Multiple Vitamin (MULTIVITAMIN) tablet Take 1 tablet by mouth daily.    . nitroGLYCERIN (NITROSTAT) 0.4 MG SL tablet Place 0.4 mg under  the tongue every 5 (five) minutes x 3 doses as needed for chest pain.  0  . sacubitril-valsartan (ENTRESTO) 24-26 MG Take 1 tablet by mouth 2 (two) times daily. 60 tablet 6   No facility-administered medications prior to visit.      Allergies:   Augmentin [amoxicillin-pot clavulanate]   Social History   Socioeconomic History  . Marital status: Married    Spouse name: Not on file  . Number of children: 0  . Years of education: Not on file  . Highest education level: Not on file  Occupational History  . Occupation: OPERATIONS MANAGER  Social Needs  . Financial resource strain: Not on file  . Food insecurity:     Worry: Not on file    Inability: Not on file  . Transportation needs:    Medical: Not on file    Non-medical: Not on file  Tobacco Use  . Smoking status: Never Smoker  . Smokeless tobacco: Never Used  Substance and Sexual Activity  . Alcohol use: No    Frequency: Never  . Drug use: No  . Sexual activity: Not Currently  Lifestyle  . Physical activity:    Days per week: Not on file    Minutes per session: Not on file  . Stress: Not on file  Relationships  . Social connections:    Talks on phone: Not on file    Gets together: Not on file    Attends religious service: Not on file    Active member of club or organization: Not on file    Attends meetings of clubs or organizations: Not on file    Relationship status: Not on file  Other Topics Concern  . Not on file  Social History Narrative  . Not on file     Family History:  The patient's family history includes Healthy in his father and mother.   ROS:   Please see the history of present illness.    He wants to return to work.  He denies angina.  He has been working some from home. All other systems reviewed and are negative.   PHYSICAL EXAM:   VS:  BP 100/62   Pulse 81   Ht  (1.88 m)   Wt 219 lb 12.8 oz (99.7 kg)   BMI 28.22 kg/m    GEN: Well nourished, well developed, in no acute distress  HEENT: normal  Neck: no JVD, carotid bruits, or masses Cardiac: RRR; no murmurs, rubs, or gallops,no edema  Respiratory:  clear to auscultation bilaterally, normal work of breathing GI: soft, nontender, nondistended, + BS MS: no deformity or atrophy  Skin: warm and dry, no rash Neuro:  Alert and Oriented x 3, Strength and sensation are intact Psych: euthymic mood, full affect  Wt Readings from Last 3 Encounters:  11/15/17 219 lb 12.8 oz (99.7 kg)  10/11/17 221 lb 9.6 oz (100.5 kg)  09/18/17 219 lb (99.3 kg)      Studies/Labs Reviewed:   EKG:  EKG not repeated  Recent Labs: 08/18/2017: B Natriuretic Peptide  201.0 08/19/2017: Hemoglobin 11.9; Magnesium 1.9; Platelets 297 08/25/2017: ALT 29 09/04/2017: BUN 11; Creatinine, Ser 1.10; Potassium 4.4; Sodium 140   Lipid Panel    Component Value Date/Time   CHOL 113 08/25/2017 1400   TRIG 175 (H) 08/25/2017 1400   HDL 27 (L) 08/25/2017 1400   CHOLHDL 4.2 08/25/2017 1400   LDLCALC 51 08/25/2017 1400    Additional studies/ records that were reviewed today include:  No new data    ASSESSMENT:    1. Coronary artery disease involving coronary bypass graft of native heart with angina pectoris (HCC)   2. Chronic combined systolic and diastolic heart failure (HCC)   3. Hyperlipidemia with target LDL less than 70   4. Ischemic cardiomyopathy      PLAN:  In order of problems listed above:  1. Stable, completion of phase 2 cardiac rehab, functional class I-II.  He is cleared to return to work, full duty. 2. No evidence of volume overload.  Most recent echo 40 to 45% EF. 3. LDL target less than 70 and was at target when evaluated. 4. May not always require heart failure therapy since he was revascularized.  For the time being we will be committed to double medication therapy for LV systolic dysfunction.  21-month clinical follow-up.  Needs lipid panel on return.  Also need to have a basic metabolic panel on Entresto.    Medication Adjustments/Labs and Tests Ordered: Current medicines are reviewed at length with the patient today.  Concerns regarding medicines are outlined above.  Medication changes, Labs and Tests ordered today are listed in the Patient Instructions below. Patient Instructions  Medication Instructions:  Your physician recommends that you continue on your current medications as directed. Please refer to the Current Medication list given to you today.  Labwork: None  Testing/Procedures: None  Follow-Up: Your physician wants you to follow-up in: 6 months with Dr. Katrinka Blazing.  You will receive a reminder letter in the mail two months  in advance. If you don't receive a letter, please call our office to schedule the follow-up appointment.   Any Other Special Instructions Will Be Listed Below (If Applicable).     If you need a refill on your cardiac medications before your next appointment, please call your pharmacy.      Signed, Lesleigh Noe, MD  11/15/2017 1:22 PM    [facility] [address] [place], [place], [place]  [address] Phone: [phone]; Fax: [phone]

## 2018-02-05 ENCOUNTER — Telehealth: Payer: Self-pay | Admitting: Interventional Cardiology

## 2018-02-05 NOTE — Telephone Encounter (Signed)
New Message   Pt states that he is starting a new job, will have to travel and wants to make sure he is cleared to get on a plane. Please call

## 2018-02-05 NOTE — Telephone Encounter (Signed)
I think he is fine to start the new job with [employer].  I am not concerned about the travel.  He needs to maintain an active lifestyle continuing aerobic exercise with up to 150 minutes of moderate aerobic activity per week.  He should call back if concerns or other questions.

## 2018-02-05 NOTE — Telephone Encounter (Signed)
Pt calling in to request an appt with Dr Katrinka BlazingSmith within the next 2 weeks, if possible. Pt states rationale for this visit is because he will be starting a new job, and he has to fly out to [place] for about a week.  Pt states he was cleared by Dr Katrinka BlazingSmith to fly, but wants to absolutely be sure this is not contraindicated. Pt states he wants Dr Katrinka BlazingSmith and nurse know that his new job will be less stressful than his previous one. Pt states that he will commute/fly 25% of the time for his new job, to [place].  Pt states this will be much less of a commute than his previous position. Pt states his hours will improve too.  Pt would like to see Dr Katrinka BlazingSmith if possible, and would like reassurance that its safe from a cardiac perspective, to proceed with his new job at [employer] with infrequent flights at times. Informed the pt that Dr Katrinka BlazingSmith and his Nurse are both out of the office today, but I will forward this message to them for further review and recommendation, and follow-up with the pt thereafter. Pt verbalized understanding and agrees with this plan.

## 2018-02-06 NOTE — Telephone Encounter (Signed)
Spoke with pt and made him aware of recommendations per Dr. Smith. Pt verbalized understanding and was in agreement with this plan.  

## 2018-05-02 NOTE — Progress Notes (Signed)
Cardiology Office Note:    Date:  05/03/2018   ID:  Brad Lee, DOB January 19, 1976, MRN [id number]  PCP:  Henrine Screws, MD  Cardiologist:  Lesleigh Noe, MD   Referring MD: Henrine Screws, MD   Chief Complaint  Patient presents with  . Coronary Artery Disease  . Congestive Heart Failure    History of Present Illness:    Brad Lee is a 42 y.o. male with a hx of anterior myocardial infarction January 2019, acute systolic heart failure, ultimate multivessel coronary bypass grafting January 2019, history of syncope related to medication and overdiuresis, family history CAD, and hyperlipidemia.  Brad Lee is now 8 months post urgent coronary bypass grafting after presenting with a large anterior myocardial infarction and acute systolic heart failure.  Management was performed in [facility] because he was there on a work related detail when he became ill.  Recovery was slow with LVEF initially less than 35%.  With guideline directed medical therapy LV function has improved to the range of mildly reduced- EF 45% by echo April 2019.  He denies chest pain, orthopnea, PND, palpitations, and syncope.  Also concerned about when he will be able to have sexual intercourse.  We have had this conversation in the past.  We have not restricted this but apparently his wife is frightened of intercourse because of fear that it may cause cardiac problem.  Past Medical History:  Diagnosis Date  . CAD (coronary artery disease)    a. 07/2017 s/p Ant STEMI/Cath [facility], [place]): LAD 100->attempt to open LAD failed, high grade dzs in LCX/RCA--->CABG x 5 (LIMA->LAD, VG->Diag, VG->OM, VG->PDA->RPL.  Marland Kitchen HFrEF (heart failure with reduced ejection fraction) (HCC)    a. 07/29/2017 Echo: EF 45%; b. 08/06/2017 TEE: EF 30-35%.  . Ischemic cardiomyopathy    a. 07/29/2017 Echo: EF 45%; b. 08/06/2017 TEE: EF 30-35%-->LifeVest placed following CABG.  . LVH (left ventricular hypertrophy)   . Mitral regurgitation   .  Respiratory failure, acute (HCC)   . 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 Syncope     Past Surgical History:  Procedure Laterality Date  . CORONARY ARTERY BYPASS GRAFT  07/28/2017  . TONSILLECTOMY      Current Medications: Current Meds  Medication Sig  . albuterol (PROVENTIL HFA;VENTOLIN HFA) 108 (90 Base) MCG/ACT inhaler Inhale 2 puffs into the lungs every 6 (six) hours as needed for wheezing or shortness of breath.  Marland Kitchen aspirin 81 MG chewable tablet Chew 1 tablet (81 mg total) by mouth daily.  Marland Kitchen atorvastatin (LIPITOR) 40 MG tablet Take 1 tablet (40 mg total) by mouth daily.  . calcium carbonate (TUMS - DOSED IN MG ELEMENTAL CALCIUM) 500 MG chewable tablet Chew 1 tablet by mouth daily as needed for indigestion or heartburn.   . cetirizine (ZYRTEC) 10 MG tablet Take 10 mg by mouth daily as needed for allergies.   . famotidine (PEPCID) 10 MG tablet Take 10 mg by mouth daily as needed for heartburn or indigestion.   . fluticasone (FLONASE) 50 MCG/ACT nasal spray Place 1 spray into both nostrils daily as needed for allergies.   . metoprolol succinate (TOPROL-XL) 50 MG 24 hr tablet Take 1 tablet (50 mg total) by mouth daily. Take with or immediately following a meal.  . Multiple Vitamin (MULTIVITAMIN) tablet Take 1 tablet by mouth daily.  . nitroGLYCERIN (NITROSTAT) 0.4 MG SL tablet Place 0.4 mg under the tongue every 5 (five) minutes x 3 doses as needed for chest pain.  Marland Kitchen  sacubitril-valsartan (ENTRESTO) 24-26 MG Take 1 tablet by mouth 2 (two) times daily.     Allergies:   Augmentin [amoxicillin-pot clavulanate]   Social History   Socioeconomic History  . Marital status: Married    Spouse name: Not on file  . Number of children: 0  . Years of education: Not on file  . Highest education level: Not on file  Occupational History  . Occupation: OPERATIONS MANAGER  Social Needs  . Financial resource strain: Not on file  . Food insecurity:    Worry: Not on file    Inability: Not on file  .  Transportation needs:    Medical: Not on file    Non-medical: Not on file  Tobacco Use  . Smoking status: Never Smoker  . Smokeless tobacco: Never Used  Substance and Sexual Activity  . Alcohol use: No    Frequency: Never  . Drug use: No  . Sexual activity: Not Currently  Lifestyle  . Physical activity:    Days per week: Not on file    Minutes per session: Not on file  . Stress: Not on file  Relationships  . Social connections:    Talks on phone: Not on file    Gets together: Not on file    Attends religious service: Not on file    Active member of club or organization: Not on file    Attends meetings of clubs or organizations: Not on file    Relationship status: Not on file  Other Topics Concern  . Not on file  Social History Narrative  . Not on file     Family History: The patient's family history includes Healthy in his father and mother.  ROS:   Please see the history of present illness.    He has gained some weight.  He is not quite as physically active as previous.  He is back into full work schedule but not required to do international work which was requiring phone calls in the middle of the night due to differences in time zone.  Appetite is been stable.  All other systems reviewed and are negative.  EKGs/Labs/Other Studies Reviewed:    The following studies were reviewed today: No new data.  EKG:  EKG is not ordered today.    Recent Labs: 08/18/2017: B Natriuretic Peptide 201.0 08/19/2017: Hemoglobin 11.9; Magnesium 1.9; Platelets 297 08/25/2017: ALT 29 09/04/2017: BUN 11; Creatinine, Ser 1.10; Potassium 4.4; Sodium 140  Recent Lipid Panel    Component Value Date/Time   CHOL 113 08/25/2017 1400   TRIG 175 (H) 08/25/2017 1400   HDL 27 (L) 08/25/2017 1400   CHOLHDL 4.2 08/25/2017 1400   LDLCALC 51 08/25/2017 1400    Physical Exam:    VS:  BP 112/62   Pulse 74   Ht 6\' 2"  (1.88 m)   Wt 232 lb 12.8 oz (105.6 kg)   SpO2 98%   BMI 29.89 kg/m     Wt  Readings from Last 3 Encounters:  05/03/18 232 lb 12.8 oz (105.6 kg)  11/15/17 219 lb 12.8 oz (99.7 kg)  10/11/17 221 lb 9.6 oz (100.5 kg)     GEN:  Well nourished, well developed in no acute distress HEENT: Normal NECK: No JVD. LYMPHATICS: No lymphadenopathy CARDIAC: RRR, no murmur, no gallop, no edema. VASCULAR: 2+ bilateral radial pulses.  No bruits. RESPIRATORY:  Clear to auscultation without rales, wheezing or rhonchi  ABDOMEN: Soft, non-tender, non-distended, No pulsatile mass, MUSCULOSKELETAL: No deformity  SKIN: Warm  and dry NEUROLOGIC:  Alert and oriented x 3 PSYCHIATRIC:  Normal affect   ASSESSMENT:    1. Chronic combined systolic and diastolic heart failure (HCC)   2. Coronary artery disease involving coronary bypass graft of native heart with angina pectoris (HCC)   3. Hyperlipidemia with target LDL less than 70   4. Ischemic cardiomyopathy    PLAN:    In order of problems listed above:  1. No evidence of volume overload and no symptoms of heart failure on guideline directed therapy for systolic dysfunction including Entresto, Toprol, and salt restriction. 2. Stable post bypass surgery without angina.  Aggressive secondary risk modification is recommended: LDL less than 70, hemoglobin A1c less than 7, smoking cessation if appropriate, blood pressure 130/80 mmHg or less, weight control, screening for sleep apnea if appropriate, and at least 150 minutes of moderate aerobic activity per week.  We again discussed activities and there is no restriction on sexual intercourse. 3. Will perform a lipid panel fasting.  When last evaluated in February LDL was 51. 4. Not addressed  Continue aggressive risk modification.  Long discussion concerning diet with recommendations to decrease simple carbohydrates in diet and replace with plant-based ingredients.  He is particularly concerned about sodium intake trying to stay less than 2 g/day.  Now the LV function has improved and there  is no evidence of volume overload he needs to concentrate more on elimination controlling quantity of simple carbohydrates and getting more plant-based complex carbohydrates and fiber in his diet.  Also reiterated the importance of greater than 150 minutes of moderate aerobic activity.  Extended office visit.  Greater than 50% of the time was spent in counseling and coordination of care.   Medication Adjustments/Labs and Tests Ordered: Current medicines are reviewed at length with the patient today.  Concerns regarding medicines are outlined above.  Orders Placed This Encounter  Procedures  . Basic metabolic panel  . Pro b natriuretic peptide  . Hepatic function panel  . Lipid Profile   No orders of the defined types were placed in this encounter.   Patient Instructions  Medication Instructions:  Your physician recommends that you continue on your current medications as directed. Please refer to the Current Medication list given to you today.  If you need a refill on your cardiac medications before your next appointment, please call your pharmacy.   Lab work: BMET, Liver, Lipid and Pro BNP today.  If you have labs (blood work) drawn today and your tests are completely normal, you will receive your results only by: Marland Kitchen MyChart Message (if you have MyChart) OR . A paper copy in the mail If you have any lab test that is abnormal or we need to change your treatment, we will call you to review the results.  Testing/Procedures: None  Follow-Up: At [facility], you and your health needs are our priority.  As part of our continuing mission to provide you with exceptional heart care, we have created designated Provider Care Teams.  These Care Teams include your primary Cardiologist (physician) and Advanced Practice Providers (APPs -  Physician Assistants and Nurse Practitioners) who all work together to provide you with the care you need, when you need it. You will need a follow up  appointment in 8 months.  Please call our office 2 months in advance to schedule this appointment.  You may see Lesleigh Noe, MD or one of the following Advanced Practice Providers on your designated Care Team:  Norma Fredrickson, NP Nada Boozer, NP . Georgie Chard, NP  Any Other Special Instructions Will Be Listed Below (If Applicable).       Signed, Lesleigh Noe, MD  05/03/2018 11:09 AM    [facility]

## 2018-05-03 ENCOUNTER — Ambulatory Visit: Payer: Managed Care, Other (non HMO) | Admitting: Interventional Cardiology

## 2018-05-03 ENCOUNTER — Encounter: Payer: Self-pay | Admitting: Interventional Cardiology

## 2018-05-03 VITALS — BP 112/62 | HR 74 | Ht 74.0 in | Wt 232.8 lb

## 2018-05-03 DIAGNOSIS — I25709 Atherosclerosis of coronary artery bypass graft(s), unspecified, with unspecified angina pectoris: Secondary | ICD-10-CM | POA: Diagnosis not present

## 2018-05-03 DIAGNOSIS — E785 Hyperlipidemia, unspecified: Secondary | ICD-10-CM

## 2018-05-03 DIAGNOSIS — I255 Ischemic cardiomyopathy: Secondary | ICD-10-CM | POA: Diagnosis not present

## 2018-05-03 DIAGNOSIS — I5042 Chronic combined systolic (congestive) and diastolic (congestive) heart failure: Secondary | ICD-10-CM | POA: Diagnosis not present

## 2018-05-03 LAB — BASIC METABOLIC PANEL
BUN/Creatinine Ratio: 8 — ABNORMAL LOW (ref 9–20)
BUN: 11 mg/dL (ref 6–24)
CALCIUM: 9.8 mg/dL (ref 8.7–10.2)
CHLORIDE: 99 mmol/L (ref 96–106)
CO2: 23 mmol/L (ref 20–29)
Creatinine, Ser: 1.4 mg/dL — ABNORMAL HIGH (ref 0.76–1.27)
GFR calc Af Amer: 72 mL/min/{1.73_m2} (ref >59)
GFR calc non Af Amer: 62 mL/min/{1.73_m2} (ref >59)
Glucose: 95 mg/dL (ref 65–99)
POTASSIUM: 4.4 mmol/L (ref 3.5–5.2)
Sodium: 139 mmol/L (ref 134–144)

## 2018-05-03 LAB — HEPATIC FUNCTION PANEL
ALBUMIN: 4.5 g/dL (ref 3.5–5.5)
ALT: 22 IU/L (ref 0–44)
AST: 16 IU/L (ref 0–40)
Alkaline Phosphatase: 69 IU/L (ref 39–117)
BILIRUBIN TOTAL: 0.5 mg/dL (ref 0.0–1.2)
Bilirubin, Direct: 0.16 mg/dL (ref 0.00–0.40)
TOTAL PROTEIN: 7.4 g/dL (ref 6.0–8.5)

## 2018-05-03 LAB — LIPID PANEL
CHOL/HDL RATIO: 3.6 ratio (ref 0.0–5.0)
Cholesterol, Total: 135 mg/dL (ref 100–199)
HDL: 37 mg/dL — AB (ref >39)
LDL CALC: 48 mg/dL (ref 0–99)
TRIGLYCERIDES: 249 mg/dL — AB (ref 0–149)
VLDL CHOLESTEROL CAL: 50 mg/dL — AB (ref 5–40)

## 2018-05-03 LAB — PRO B NATRIURETIC PEPTIDE: NT-PRO BNP: 129 pg/mL — AB (ref 0–86)

## 2018-05-03 NOTE — Patient Instructions (Signed)
Medication Instructions:  Your physician recommends that you continue on your current medications as directed. Please refer to the Current Medication list given to you today.  If you need a refill on your cardiac medications before your next appointment, please call your pharmacy.   Lab work: BMET, Liver, Lipid and Pro BNP today.  If you have labs (blood work) drawn today and your tests are completely normal, you will receive your results only by: Marland Kitchen MyChart Message (if you have MyChart) OR . A paper copy in the mail If you have any lab test that is abnormal or we need to change your treatment, we will call you to review the results.  Testing/Procedures: None  Follow-Up: At [facility], you and your health needs are our priority.  As part of our continuing mission to provide you with exceptional heart care, we have created designated Provider Care Teams.  These Care Teams include your primary Cardiologist (physician) and Advanced Practice Providers (APPs -  Physician Assistants and Nurse Practitioners) who all work together to provide you with the care you need, when you need it. You will need a follow up appointment in 8 months.  Please call our office 2 months in advance to schedule this appointment.  You may see Lesleigh Noe, MD or one of the following Advanced Practice Providers on your designated Care Team:   Norma Fredrickson, NP Nada Boozer, NP . Georgie Chard, NP  Any Other Special Instructions Will Be Listed Below (If Applicable).

## 2018-05-08 ENCOUNTER — Telehealth: Payer: Self-pay | Admitting: *Deleted

## 2018-05-08 DIAGNOSIS — Z79899 Other long term (current) drug therapy: Secondary | ICD-10-CM

## 2018-05-08 DIAGNOSIS — E785 Hyperlipidemia, unspecified: Secondary | ICD-10-CM

## 2018-05-08 DIAGNOSIS — I5022 Chronic systolic (congestive) heart failure: Secondary | ICD-10-CM

## 2018-05-08 NOTE — Telephone Encounter (Signed)
Left message to call back  

## 2018-05-08 NOTE — Telephone Encounter (Signed)
-----   Message from Lyn Records, MD sent at 05/06/2018 10:07 PM EDT ----- Let the patient know the creat is a little elevated and the TG is too high. Needs to recheck CMET and Lipid 4 weeks after starting VASCEPA 1000 mg BID. A copy will be sent to Henrine Screws, MD

## 2018-05-09 MED ORDER — ICOSAPENT ETHYL 1 G PO CAPS: 1.0000 g | ORAL_CAPSULE | Freq: Two times a day (BID) | ORAL | 11 refills | Status: DC

## 2018-05-09 NOTE — Telephone Encounter (Signed)
Called pt to review labs.  Advised of order to start Vascepa for treatment of elevated triglycerides and to have lab work in 4 weeks to f/u lipid and elevated creatinine.  Pt is asking for clarification on whether he is to stop his atorvastatin.  Advised Vascepa is generally given in combination with statin medications and there is no order from Dr Katrinka Blazing to discontinue.  He, however wants to verify with Dr Katrinka Blazing.  Will review with Dr Katrinka Blazing and call patient back with orders.  Will send RX, order labs and schedule lab appointment at that time.

## 2018-05-09 NOTE — Telephone Encounter (Signed)
Vascepa should be added to his statin.

## 2018-05-09 NOTE — Telephone Encounter (Signed)
Pt aware to add Vascepa to medications and RX sent into [facility] as requested.  Reviewed foods to avoid to help lower triglycerides. Scheduled CMET and Lipid for 06/11/2018.  Pt aware to be fasting.  He will call back if any further questions.

## 2018-05-09 NOTE — Telephone Encounter (Signed)
Follow up  ° ° °Patient is returning call.  °

## 2018-05-11 ENCOUNTER — Telehealth: Payer: Self-pay | Admitting: Interventional Cardiology

## 2018-05-11 ENCOUNTER — Telehealth: Payer: Self-pay

## 2018-05-11 NOTE — Telephone Encounter (Signed)
I have done a Vascepa PA through covermymeds. Key: [id number]  I received the following message through covermymeds: Dannial Monarch Key: [id number] - PA Case ID: [id number]  Outcome  Approved today  [id number];EXBMWU:[id number];Review Type:Prior Auth;Coverage Start Date:04/11/2018;Coverage End Date:05/10/2021;

## 2018-05-11 NOTE — Telephone Encounter (Signed)
Pt went to pharmacy yesterday and they told him he needed a PA for Vascepa.  Advised pt that Larita Fife, LPN did one today and received the approval back.  Pt appreciative for assistance.

## 2018-05-11 NOTE — Telephone Encounter (Signed)
New message   Pt c/o medication issue:  1. Name of Medication: Icosapent Ethyl (VASCEPA) 1 g CAPS  2. How are you currently taking this medication (dosage and times per day)? n/a  3. Are you having a reaction (difficulty breathing--STAT)? n/a  4. What is your medication issue? Patient states that the [facility] in [place], [place] states that there needs to be preauthorization for this medication.

## 2018-06-11 ENCOUNTER — Other Ambulatory Visit: Payer: Managed Care, Other (non HMO)

## 2018-06-12 ENCOUNTER — Other Ambulatory Visit: Payer: Managed Care, Other (non HMO) | Admitting: *Deleted

## 2018-06-12 DIAGNOSIS — Z79899 Other long term (current) drug therapy: Secondary | ICD-10-CM

## 2018-06-12 DIAGNOSIS — I5022 Chronic systolic (congestive) heart failure: Secondary | ICD-10-CM

## 2018-06-12 DIAGNOSIS — E785 Hyperlipidemia, unspecified: Secondary | ICD-10-CM

## 2018-06-12 LAB — LIPID PANEL
Chol/HDL Ratio: 3.6 ratio (ref 0.0–5.0)
Cholesterol, Total: 124 mg/dL (ref 100–199)
HDL: 34 mg/dL — ABNORMAL LOW (ref >39)
LDL Calculated: 53 mg/dL (ref 0–99)
Triglycerides: 187 mg/dL — ABNORMAL HIGH (ref 0–149)
VLDL Cholesterol Cal: 37 mg/dL (ref 5–40)

## 2018-06-12 LAB — COMPREHENSIVE METABOLIC PANEL
A/G RATIO: 1.6 (ref 1.2–2.2)
ALBUMIN: 4.4 g/dL (ref 3.5–5.5)
ALT: 28 IU/L (ref 0–44)
AST: 16 IU/L (ref 0–40)
Alkaline Phosphatase: 62 IU/L (ref 39–117)
BILIRUBIN TOTAL: 0.8 mg/dL (ref 0.0–1.2)
BUN / CREAT RATIO: 10 (ref 9–20)
BUN: 13 mg/dL (ref 6–24)
CHLORIDE: 100 mmol/L (ref 96–106)
CO2: 22 mmol/L (ref 20–29)
Calcium: 9.3 mg/dL (ref 8.7–10.2)
Creatinine, Ser: 1.32 mg/dL — ABNORMAL HIGH (ref 0.76–1.27)
GFR calc non Af Amer: 67 mL/min/{1.73_m2} (ref >59)
GFR, EST AFRICAN AMERICAN: 77 mL/min/{1.73_m2} (ref >59)
Globulin, Total: 2.7 g/dL (ref 1.5–4.5)
Glucose: 114 mg/dL — ABNORMAL HIGH (ref 65–99)
POTASSIUM: 4.4 mmol/L (ref 3.5–5.2)
SODIUM: 138 mmol/L (ref 134–144)
TOTAL PROTEIN: 7.1 g/dL (ref 6.0–8.5)

## 2018-07-17 ENCOUNTER — Ambulatory Visit: Payer: Managed Care, Other (non HMO) | Admitting: Physician Assistant

## 2018-07-17 ENCOUNTER — Telehealth: Payer: Self-pay | Admitting: Interventional Cardiology

## 2018-07-17 ENCOUNTER — Other Ambulatory Visit: Payer: Self-pay

## 2018-07-17 ENCOUNTER — Emergency Department (HOSPITAL_BASED_OUTPATIENT_CLINIC_OR_DEPARTMENT_OTHER): Payer: Managed Care, Other (non HMO)

## 2018-07-17 ENCOUNTER — Encounter (HOSPITAL_BASED_OUTPATIENT_CLINIC_OR_DEPARTMENT_OTHER): Payer: Self-pay | Admitting: Emergency Medicine

## 2018-07-17 ENCOUNTER — Emergency Department (HOSPITAL_BASED_OUTPATIENT_CLINIC_OR_DEPARTMENT_OTHER)
Admission: EM | Admit: 2018-07-17 | Discharge: 2018-07-17 | Disposition: A | Discharge status: Home / Self Care | Payer: Managed Care, Other (non HMO) | Attending: Emergency Medicine | Admitting: Emergency Medicine

## 2018-07-17 DIAGNOSIS — M79601 Pain in right arm: Secondary | ICD-10-CM

## 2018-07-17 DIAGNOSIS — Z79899 Other long term (current) drug therapy: Secondary | ICD-10-CM | POA: Insufficient documentation

## 2018-07-17 DIAGNOSIS — Z7982 Long term (current) use of aspirin: Secondary | ICD-10-CM | POA: Insufficient documentation

## 2018-07-17 DIAGNOSIS — Z951 Presence of aortocoronary bypass graft: Secondary | ICD-10-CM | POA: Diagnosis not present

## 2018-07-17 DIAGNOSIS — I5042 Chronic combined systolic (congestive) and diastolic (congestive) heart failure: Secondary | ICD-10-CM | POA: Diagnosis not present

## 2018-07-17 DIAGNOSIS — M792 Neuralgia and neuritis, unspecified: Secondary | ICD-10-CM

## 2018-07-17 DIAGNOSIS — D751 Secondary polycythemia: Secondary | ICD-10-CM

## 2018-07-17 DIAGNOSIS — R0789 Other chest pain: Secondary | ICD-10-CM | POA: Diagnosis present

## 2018-07-17 LAB — TROPONIN I
Troponin I: <0.03 ng/mL (ref <0.03)
Troponin I: <0.03 ng/mL (ref <0.03)

## 2018-07-17 LAB — BASIC METABOLIC PANEL
Anion gap: 7 (ref 5–15)
BUN: 13 mg/dL (ref 6–20)
CO2: 23 mmol/L (ref 22–32)
Calcium: 9.2 mg/dL (ref 8.9–10.3)
Chloride: 106 mmol/L (ref 98–111)
Creatinine, Ser: 1.19 mg/dL (ref 0.61–1.24)
GFR calc Af Amer: >60 mL/min (ref >60)
GFR calc non Af Amer: >60 mL/min (ref >60)
Glucose, Bld: 101 mg/dL — ABNORMAL HIGH (ref 70–99)
Potassium: 4 mmol/L (ref 3.5–5.1)
Sodium: 136 mmol/L (ref 135–145)

## 2018-07-17 LAB — CBC
HCT: 55.7 % — ABNORMAL HIGH (ref 39.0–52.0)
Hemoglobin: 17.9 g/dL — ABNORMAL HIGH (ref 13.0–17.0)
MCH: 26.4 pg (ref 26.0–34.0)
MCHC: 32.1 g/dL (ref 30.0–36.0)
MCV: 82.3 fL (ref 80.0–100.0)
PLATELETS: 224 10*3/uL (ref 150–400)
RBC: 6.77 MIL/uL — ABNORMAL HIGH (ref 4.22–5.81)
RDW: 15.1 % (ref 11.5–15.5)
WBC: 8.2 10*3/uL (ref 4.0–10.5)
nRBC: 0 % (ref 0.0–0.2)

## 2018-07-17 MED ORDER — PREDNISONE 50 MG PO TABS: 50.0000 mg | ORAL_TABLET | Freq: Every day | ORAL | 0 refills | Status: DC

## 2018-07-17 MED ORDER — ASPIRIN 81 MG PO CHEW
324.0000 mg | CHEWABLE_TABLET | Freq: Once | ORAL | Status: AC
  Administered 2018-07-17: 324 mg via ORAL
  Filled 2018-07-17: qty 4

## 2018-07-17 MED ORDER — IBUPROFEN 400 MG PO TABS
400.0000 mg | ORAL_TABLET | Freq: Once | ORAL | Status: AC
  Administered 2018-07-17: 400 mg via ORAL
  Filled 2018-07-17: qty 1

## 2018-07-17 MED ORDER — NAPROXEN 500 MG PO TABS: 500.0000 mg | ORAL_TABLET | Freq: Two times a day (BID) | ORAL | 0 refills | Status: DC

## 2018-07-17 NOTE — Progress Notes (Deleted)
Cardiology Office Note:    Date:  07/17/2018   ID:  Brad Lee, DOB 04/10/1976, MRN [id number]  PCP:  Henrine Screwshacker, Robert, MD  Cardiologist:  Lesleigh NoeHenry W Smith III, MD *** Electrophysiologist:  None   Referring MD: Henrine Screwshacker, Robert, MD   No chief complaint on file. ***  History of Present Illness:    Brad Lee is a 42 y.o. male with coronary artery disease s/p anterior ST elevation myocardial infarction in 07/2017 while [place], systolic CHF secondary to ischemic cardiomyopathy, hyperlipidemia.  When he presented with his MI in 07/2017, he was noted to have severe 3 v CAD with an occluded LAD and severe disease in the LCx and RCA.  He underwent POBA to the distal RCA and had an IABP placed.  He ultimately went to CABG and had a L-LAD, S-Dx, S-OM, S-PDA/PL.  EF by TEE in 07/2017 was 30-35.  A follow up [place] in [place] demonstrated and EF of 45-50.  He was last seen by Dr. Katrinka BlazingSmith 04/2018.  ***  Brad Lee returns for the evaluation of chest pain.  He called in this morning with R hand and arm paresthesias as well as R sided chest pain.  He was added on to my schedule.  ***  Prior CV studies:   The following studies were reviewed today:  *** Echo 10/20/17 Mild conc LVH, EF 45-50, ant-sept and inf-sept HK, normal diastolic function, mild MR, normal RVSF, mild TR, PASP 22  [facility] from [facility] [place]:  Carotid duplex and Doppler study preop for surgery: No significant obstruction/less than 50% bilateral extracranial carotid arteries.  Echocardiogram 07/25/2017 EF 50% with anterolateral and apical hypokinesis.    Echocardiogram 07/27/2017 with EF 55-60% hypokinesis of the basal apical walls.    Echocardiogram 07/29/17 EF 40-45% with apical and apical hypokinesis.  Cardiac cath 07/25/2017: Severe multivessel coronary disease with 70% ostial and chronic total occlusion of the mid LAD.  No visible diagonals were noted.  Circumflex is felt to be  small after the first obtuse marginal origin.  First obtuse marginal 90% ostial proximal narrowing and there is 90% obstruction in the inferior limb after bifurcating RCA is diffusely diseased 50% dated 70% RV marginal, and distal 99% RCA proximal to the PDA and LV branch.  Normal left ventricular ejection fraction of 50-60% with LVEDP 24 mmHg.  1.2 mm balloon angioplasty of the distal right coronary was performed.  Coronary bypass grafting on 07/28/2017: LIMA to LAD, SVG to diagonal, SVG to OM1, sequential SVG to PDA and posterolateral.  Also mention of left groin exploration by cutdown for removal of clotted/static intra-aortic balloon pump and sheath with placement of new sheath and balloon pump through open groin.  Transesophageal echocardiography performed on 08/10/2017 to rule out valvular vegetation demonstrated EF 30-35% with diffuse hypokinesis, no other significant findings were mentioned.  08/14/17 [facility] revealed a low iron saturation sent with total iron of 42, mildly elevated alkaline phosphatase at 159.  The BUN and creatinine 13 and 1.19 with normal blood sugar.  Potassium 5.1.  Hemoglobin 13.5.  Platelet count 593,000.   Past Medical History:  Diagnosis Date  . CAD (coronary artery disease)    a. 07/2017 s/p Ant STEMI/Cath [facility]([place] LAD 100->attempt to open LAD failed, high grade dzs in LCX/RCA--->CABG x 5 (LIMA->LAD, VG->Diag, VG->OM, VG->PDA->RPL.  Marland Kitchen. HFrEF (heart failure with reduced ejection fraction) (HCC)    a. 07/29/2017 Echo:  EF 45%; b. 08/06/2017 TEE: EF 30-35%.  . Ischemic cardiomyopathy    a. 07/29/2017 Echo: EF 45%; b. 08/06/2017 TEE: EF 30-35%-->LifeVest placed following CABG.  . LVH (left ventricular hypertrophy)   . Mitral regurgitation   . Respiratory failure, acute (HCC)   . 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 Syncope    Surgical Hx: The patient  has a past surgical history that includes Coronary artery bypass graft (07/28/2017)  and Tonsillectomy.   Current Medications: No outpatient medications have been marked as taking for the 07/17/18 encounter (Appointment) with Brad Lee, Brad Mcconnell Lee, Brad Lee.     Allergies:   Augmentin [amoxicillin-pot clavulanate]   Social History   Tobacco Use  . Smoking status: Never Smoker  . Smokeless tobacco: Never Used  Substance Use Topics  . Alcohol use: No    Frequency: Never  . Drug use: No     Family Hx: The patient's family history includes Healthy in his father and mother.  ROS:   Please see the history of present illness.    ROS All other systems reviewed and are negative.   EKGs/Labs/Other Test Reviewed:    EKG:  EKG is *** ordered today.  The ekg ordered today demonstrates ***  Recent Labs: 08/18/2017: B Natriuretic Peptide 201.0 08/19/2017: Hemoglobin 11.9; Magnesium 1.9; Platelets 297 05/03/2018: NT-Pro BNP 129 06/12/2018: ALT 28; BUN 13; Creatinine, Ser 1.32; Potassium 4.4; Sodium 138   Recent Lipid Panel Lab Results  Component Value Date/Time   CHOL 124 06/12/2018 10:44 AM   TRIG 187 (H) 06/12/2018 10:44 AM   HDL 34 (L) 06/12/2018 10:44 AM   CHOLHDL 3.6 06/12/2018 10:44 AM   LDLCALC 53 06/12/2018 10:44 AM    Physical Exam:    VS:  There were no vitals taken for this visit.    Wt Readings from Last 3 Encounters:  05/03/18 232 lb 12.8 oz (105.6 kg)  11/15/17 219 lb 12.8 oz (99.7 kg)  10/11/17 221 lb 9.6 oz (100.5 kg)     ***Physical Exam  ASSESSMENT & PLAN:    No diagnosis found.***  Dispo:  No follow-ups on file.   Medication Adjustments/Labs and Tests Ordered: Current medicines are reviewed at length with the patient today.  Concerns regarding medicines are outlined above.  Tests Ordered: No orders of the defined types were placed in this encounter.  Medication Changes: No orders of the defined types were placed in this encounter.   Signed, Brad NewcomerScott Fue Cervenka, Brad Lee  07/17/2018 8:57 AM    [facility] [address], [place], [place]  [address] Phone: [phone]; Fax: [phone]

## 2018-07-17 NOTE — Telephone Encounter (Signed)
Pt states he woke up yesterday AM and right hand and arm were numb like he had slept on them wrong.  Went on about his day and it improved.  Woke up this morning and had the same feeling but having some discomfort with it today.  States there is a pain radiating into the right side of his chest that reminded him of indigestion.  Not as bad now as it was this morning.  This morning couldn't make a fist but can now.  HR 85, no BP. Denies any other sx (SOB, lightheadedness, dizziness, etc).  Pt prefers to be seen in the office if possible vs going to hospital.  Scheduled pt to see Tereso NewcomerScott Weaver, PA-C at 11:45A.  Advised if symptoms change or worsen to head over to the ER.  Pt in agreement with plan.

## 2018-07-17 NOTE — Discharge Instructions (Addendum)
Take the medications as prescribed.  Follow-up with your doctor be rechecked next week.  Return as needed for worsening symptoms

## 2018-07-17 NOTE — ED Notes (Signed)
ED MD informed of pain to right arm and hand

## 2018-07-17 NOTE — Telephone Encounter (Signed)
New message   Patient states that he is having numbness and pain in hand and is not able to make a fist and the pain radiated to chest and arm and over to right side of chest. He states that this started on 07/16/2018 and is still having some of the symptoms today.

## 2018-07-17 NOTE — ED Triage Notes (Signed)
R hand and arm pain and numbness started yesterday. As the day progressed he developed pain to R chest that "feels like indigestion". Hx of MI earlier this year. Denies SOB

## 2018-07-17 NOTE — ED Provider Notes (Signed)
[facility] EMERGENCY DEPARTMENT Provider Note   CSN: [id number] Arrival date & time: 07/17/18  1008     History   Chief Complaint Chief Complaint  Patient presents with  . Chest Pain  . Arm Pain    HPI Dannial MonarchJarrett Fleischer is a 42 y.o. male.  HPI Patient presented to the emergency room for evaluation of arm and chest pain.  Patient states he woke up the other day and he had some numbness in his right arm and hand.  He felt like he slept on it the wrong way.  His symptoms slowly resolved throughout the day but he did have some sharp discomfort in his arm that did radiate towards his chest.  Patient states today he had similar symptoms where he had this sharp discomfort in his arm and numbness and tingling especially in his first 2 fingers.  Patient does have a history of heart disease.  He has had an MI in the past.  The symptoms did not feel anything like his prior cardiac symptoms.  He denies any fevers or chills.  No neck pain.  No shortness of breath.   Past Medical History:  Diagnosis Date  . CAD (coronary artery disease)    a. 07/2017 s/p Ant STEMI/Cath [facility]([place], [place]): LAD 100->attempt to open LAD failed, high grade dzs in LCX/RCA--->CABG x 5 (LIMA->LAD, VG->Diag, VG->OM, VG->PDA->RPL.  Marland Kitchen. HFrEF (heart failure with reduced ejection fraction) (HCC)    a. 07/29/2017 Echo: EF 45%; b. 08/06/2017 TEE: EF 30-35%.  . Ischemic cardiomyopathy    a. 07/29/2017 Echo: EF 45%; b. 08/06/2017 TEE: EF 30-35%-->LifeVest placed following CABG.  . LVH (left ventricular hypertrophy)   . Mitral regurgitation   . Respiratory failure, acute (HCC)   . 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 Syncope     Patient Active Problem List   Diagnosis Date Noted  . Syncope 08/18/2017  . Chronic combined systolic and diastolic heart failure (HCC) 08/18/2017  . Hyperlipidemia with target LDL less than 70 08/18/2017  . Postoperative anemia 08/18/2017  . Ischemic cardiomyopathy 08/18/2017  . Coronary artery disease  involving coronary bypass graft of native heart with angina pectoris (HCC) 08/18/2017    Past Surgical History:  Procedure Laterality Date  . CORONARY ARTERY BYPASS GRAFT  07/28/2017  . TONSILLECTOMY          Home Medications    Prior to Admission medications   Medication Sig Start Date End Date Taking? Authorizing Provider  albuterol (PROVENTIL HFA;VENTOLIN HFA) 108 (90 Base) MCG/ACT inhaler Inhale 2 puffs into the lungs every 6 (six) hours as needed for wheezing or shortness of breath.    [provider]  aspirin 81 MG chewable tablet Chew 1 tablet (81 mg total) by mouth daily. 08/29/17   Lyn RecordsSmith, Henry W, MD  atorvastatin (LIPITOR) 40 MG tablet Take 1 tablet (40 mg total) by mouth daily. 10/26/17   Lyn RecordsSmith, Henry W, MD  calcium carbonate (TUMS - DOSED IN MG ELEMENTAL CALCIUM) 500 MG chewable tablet Chew 1 tablet by mouth daily as needed for indigestion or heartburn.     [provider]  cetirizine (ZYRTEC) 10 MG tablet Take 10 mg by mouth daily as needed for allergies.     [provider]  famotidine (PEPCID) 10 MG tablet Take 10 mg by mouth daily as needed for heartburn or indigestion.     [provider]  fluticasone (FLONASE) 50 MCG/ACT nasal spray Place 1 spray into both nostrils daily as needed  for allergies.     [provider]  Icosapent Ethyl (VASCEPA) 1 g CAPS Take 1 capsule (1 g total) by mouth 2 (two) times daily. 05/09/18   Lyn RecordsSmith, Henry W, MD  metoprolol succinate (TOPROL-XL) 50 MG 24 hr tablet Take 1 tablet (50 mg total) by mouth daily. Take with or immediately following a meal. 08/29/17   Lyn RecordsSmith, Henry W, MD  Multiple Vitamin (MULTIVITAMIN) tablet Take 1 tablet by mouth daily.    [provider]  naproxen (NAPROSYN) 500 MG tablet Take 1 tablet (500 mg total) by mouth 2 (two) times daily with a meal. As needed for pain 07/17/18   Linwood DibblesKnapp, Cory Kitt, MD  nitroGLYCERIN (NITROSTAT) 0.4 MG SL tablet Place 0.4 mg under the tongue every 5  (five) minutes x 3 doses as needed for chest pain. 08/15/17   [provider]  predniSONE (DELTASONE) 50 MG tablet Take 1 tablet (50 mg total) by mouth daily. 07/17/18   Linwood DibblesKnapp, Kylen Schliep, MD  sacubitril-valsartan (ENTRESTO) 24-26 MG Take 1 tablet by mouth 2 (two) times daily. 08/25/17   Lyn RecordsSmith, Henry W, MD    Family History Family History  Problem Relation Age of Onset  . Healthy Mother   . Healthy Father     Social History Social History   Tobacco Use  . Smoking status: Never Smoker  . Smokeless tobacco: Never Used  Substance Use Topics  . Alcohol use: No    Frequency: Never  . Drug use: No     Allergies   Augmentin [amoxicillin-pot clavulanate]   Review of Systems Review of Systems  All other systems reviewed and are negative.    Physical Exam Updated Vital Signs BP 118/90   Pulse 69   Temp 98.1 F (36.7 C) (Oral)   Resp 17   Ht 1.867 m (6' 1.5")   Wt 100.7 kg   SpO2 95%   BMI 28.89 kg/m   Physical Exam Vitals signs and nursing note reviewed.  Constitutional:      General: He is not in acute distress.    Appearance: He is well-developed.  HENT:     Head: Normocephalic and atraumatic.     Right Ear: External ear normal.     Left Ear: External ear normal.  Eyes:     General: No scleral icterus.       Right eye: No discharge.        Left eye: No discharge.     Conjunctiva/sclera: Conjunctivae normal.  Neck:     Musculoskeletal: Neck supple.     Trachea: No tracheal deviation.  Cardiovascular:     Rate and Rhythm: Normal rate and regular rhythm.  Pulmonary:     Effort: Pulmonary effort is normal. No respiratory distress.     Breath sounds: Normal breath sounds. No stridor. No wheezing or rales.  Abdominal:     General: Bowel sounds are normal. There is no distension.     Palpations: Abdomen is soft.     Tenderness: There is no abdominal tenderness. There is no guarding or rebound.  Musculoskeletal:        General: No tenderness.  Skin:     General: Skin is warm and dry.     Findings: No rash.  Neurological:     Mental Status: He is alert.     Cranial Nerves: No cranial nerve deficit (no facial droop, extraocular movements intact, no slurred speech).     Sensory: No sensory deficit.     Motor: No abnormal muscle  tone or seizure activity.     Coordination: Coordination normal.     Comments: Normal strength and sensation throughout, normal grip strength bilaterally      ED Treatments / Results  Labs (all labs ordered are listed, but only abnormal results are displayed) Labs Reviewed  BASIC METABOLIC PANEL - Abnormal; Notable for the following components:      Result Value   Glucose, Bld 101 (*)    All other components within normal limits  CBC - Abnormal; Notable for the following components:   RBC 6.77 (*)    Hemoglobin 17.9 (*)    HCT 55.7 (*)    All other components within normal limits  TROPONIN I  TROPONIN I    EKG EKG Interpretation  Date/Time:  Tuesday July 17 2018 10:20:50 EST Ventricular Rate:  67 PR Interval:  196 QRS Duration: 88 QT Interval:  412 QTC Calculation: 435 R Axis:   -150 Text Interpretation:  Normal sinus rhythm Anterolateral infarct , age undetermined T wave abnormality, consider inferior ischemia Abnormal ECG No significant change since last tracing Confirmed by Linwood Dibbles [phone]) on 07/17/2018 10:29:14 AM Also confirmed by Linwood Dibbles [phone]), editor Barbette Hair [phone])  on 07/17/2018 11:11:56 AM   Radiology Dg Chest 2 View  Result Date: 07/17/2018 CLINICAL DATA:  Right arm numbness and pain radiating to the right side of the chest for 2 days. CABG 11 months ago. EXAM: CHEST - 2 VIEW COMPARISON:  CT of the chest 08/18/2017 FINDINGS: Heart size is normal. Median sternotomy is noted. Mild bibasilar opacities are present at the lung bases compatible with scarring or atelectasis. The lungs are otherwise clear. There is no edema or effusion. Visualized soft tissues and bony thorax  are otherwise within normal limits. IMPRESSION: 1. Mild bibasilar airspace disease likely representing atelectasis or scarring. 2. No acute abnormality. Electronically Signed   By: Marin Roberts M.D.   On: 07/17/2018 10:42    Procedures Procedures (including critical care time)  Medications Ordered in ED Medications  aspirin chewable tablet 324 mg (324 mg Oral Given 07/17/18 1140)  ibuprofen (ADVIL,MOTRIN) tablet 400 mg (400 mg Oral Given 07/17/18 1356)     Initial Impression / Assessment and Plan / ED Course  I have reviewed the triage vital signs and the nursing notes.  Pertinent labs & imaging results that were available during my care of the patient were reviewed by me and considered in my medical decision making (see chart for details).    Patient presented to the emergency room for evaluation of chest and arm discomfort.  Patient has a history of heart disease.  Patient symptoms are atypical for ACS.  He is having sharp pain in his arm associated with some paresthesias.  Patient's serial cardiac enzymes are normal.  His EKG is unchanged.  Patient is not having any dyspnea.  I doubt aortic dissection or pulmonary embolism.  Patient symptoms seem to be related to a radicular etiology.  I will have him try a course of steroids and anti-inflammatory agents.  I discussed outpatient follow-up with his primary doctor.  Warning signs precautions discussed.  Final Clinical Impressions(s) / ED Diagnoses   Final diagnoses:  Right arm pain  Neuropathic pain  Atypical chest pain  Polycythemia    ED Discharge Orders         Ordered    predniSONE (DELTASONE) 50 MG tablet  Daily     07/17/18 1456    naproxen (NAPROSYN) 500 MG tablet  2 times  daily with meals     07/17/18 1456           Linwood Dibbles, MD 07/17/18 [phone]

## 2018-07-17 NOTE — ED Notes (Signed)
ED Provider at bedside. 

## 2018-07-17 NOTE — ED Notes (Signed)
C/O of numbness to right arm, ED MD informed.

## 2018-08-17 ENCOUNTER — Other Ambulatory Visit: Payer: Self-pay | Admitting: Interventional Cardiology

## 2018-08-19 ENCOUNTER — Other Ambulatory Visit: Payer: Self-pay | Admitting: Interventional Cardiology

## 2018-08-20 ENCOUNTER — Other Ambulatory Visit: Payer: Self-pay | Admitting: Interventional Cardiology

## 2018-11-13 ENCOUNTER — Other Ambulatory Visit: Payer: Self-pay | Admitting: Interventional Cardiology

## 2019-01-08 ENCOUNTER — Other Ambulatory Visit: Payer: Self-pay | Admitting: Interventional Cardiology

## 2019-01-08 MED ORDER — NITROGLYCERIN 0.4 MG SL SUBL: 0.4000 mg | SUBLINGUAL_TABLET | SUBLINGUAL | 3 refills | Status: DC | PRN

## 2019-01-10 ENCOUNTER — Telehealth: Payer: Self-pay | Admitting: Interventional Cardiology

## 2019-01-10 NOTE — Progress Notes (Signed)
Virtual Visit via Video Note   This visit type was conducted due to national recommendations for restrictions regarding the COVID-19 Pandemic (e.g. social distancing) in an effort to limit this patient's exposure and mitigate transmission in our community.  Due to his co-morbid illnesses, this patient is at least at moderate risk for complications without adequate follow up.  This format is felt to be most appropriate for this patient at this time.  All issues noted in this document were discussed and addressed.  A limited physical exam was performed with this format.  Please refer to the patient's chart for his consent to [facility]health [facility].   Date:  01/11/2019   ID:  Brad Lee, DOB 01/31/1976, MRN [id number]  Patient Location: Home Provider Location: Office  PCP:  Henrine Screwshacker, Robert, MD  Cardiologist:  Lesleigh NoeHenry W Sederick Jacobsen III, MD  Electrophysiologist:  None   Evaluation Performed:  Follow-Up Visit  Chief Complaint:  CAD/CHF  History of Present Illness:    Brad MonarchJarrett Cortinas is a 43 y.o. 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  Doing okay.  Has gained weight.  Back to 12-hour work weeks.  This is interfered with him being able to take care of himself with reference to exercise, diet, and weight control.  No ischemic symptoms.  No arrhythmias or palpitations noted.  No medication side effects.  Denies lower extremity swelling.  No orthopnea or PND.  The patient does not have symptoms concerning for COVID-19 infection (fever, chills, cough, or new shortness of breath).    Past Medical History:  Diagnosis Date  . CAD (coronary artery disease)    a. 07/2017 s/p Ant STEMI/Cath [facility]([place], [place]): LAD 100->attempt to open LAD failed, high grade dzs in LCX/RCA--->CABG x 5 (LIMA->LAD, VG->Diag,  VG->OM, VG->PDA->RPL.  Marland Kitchen. HFrEF (heart failure with reduced ejection fraction) (HCC)    a. 07/29/2017 Echo: EF 45%; b. 08/06/2017 TEE: EF 30-35%.  . Ischemic cardiomyopathy    a. 07/29/2017 Echo: EF 45%; b. 08/06/2017 TEE: EF 30-35%-->LifeVest placed following CABG.  . LVH (left ventricular hypertrophy)   . Mitral regurgitation   . Respiratory failure, acute (HCC)   . 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 Syncope    Past Surgical History:  Procedure Laterality Date  . CORONARY ARTERY BYPASS GRAFT  07/28/2017  . TONSILLECTOMY       Current Meds  Medication Sig  . albuterol (PROVENTIL HFA;VENTOLIN HFA) 108 (90 Base) MCG/ACT inhaler Inhale 2 puffs into the lungs every 6 (six) hours as needed for wheezing or shortness of breath.  . ASPIRIN LOW DOSE 81 MG chewable tablet CHEW 1 TABLET (81 MG TOTAL) BY MOUTH DAILY.  Marland Kitchen. atorvastatin (LIPITOR) 40 MG tablet TAKE 1 TABLET BY MOUTH EVERY DAY  . calcium carbonate (TUMS - DOSED IN MG ELEMENTAL CALCIUM) 500 MG chewable tablet Chew 1 tablet by mouth daily as needed for indigestion or heartburn.   . cetirizine (ZYRTEC) 10 MG tablet Take 10 mg by mouth daily as needed for allergies.   Marland Kitchen. ENTRESTO 24-26 MG TAKE 1 TABLET BY MOUTH TWICE A DAY  . famotidine (PEPCID) 10 MG tablet Take 10 mg by mouth daily as needed for heartburn or indigestion.   . fluticasone (FLONASE) 50 MCG/ACT nasal spray Place 1 spray into both nostrils daily as needed for allergies.   Bess Harvest. Icosapent Ethyl (VASCEPA) 1  g CAPS Take 1 capsule (1 g total) by mouth 2 (two) times daily.  . metoprolol succinate (TOPROL-XL) 50 MG 24 hr tablet TAKE 1 TABLET (50 MG TOTAL) BY MOUTH DAILY. TAKE WITH OR IMMEDIATELY FOLLOWING A MEAL.  . Multiple Vitamin (MULTIVITAMIN) tablet Take 1 tablet by mouth daily.  . naproxen (NAPROSYN) 500 MG tablet Take 1 tablet (500 mg total) by mouth 2 (two) times daily with a meal. As needed for pain  . nitroGLYCERIN (NITROSTAT) 0.4 MG SL tablet Place 1 tablet (0.4 mg total) under the tongue  every 5 (five) minutes x 3 doses as needed for chest pain.  . predniSONE (DELTASONE) 50 MG tablet Take 1 tablet (50 mg total) by mouth daily.     Allergies:   Augmentin [amoxicillin-pot clavulanate]   Social History   Tobacco Use  . Smoking status: Never Smoker  . Smokeless tobacco: Never Used  Substance Use Topics  . Alcohol use: No    Frequency: Never  . Drug use: No     Family Hx: The patient's family history includes Healthy in his father and mother.  ROS:   Please see the history of present illness.    Pinched nerve in his left arm was leading to neck and arm pain. All other systems reviewed and are negative.   Prior CV studies:   The following studies were reviewed today:  No recent CV imaging.  Last echo was April 2019.  We will repeat an echocardiogram before year end.  Labs/Other Tests and Data Reviewed:    EKG:  No ECG reviewed.  Recent Labs: 05/03/2018: NT-Pro BNP 129 06/12/2018: ALT 28 07/17/2018: BUN 13; Creatinine, Ser 1.19; Hemoglobin 17.9; Platelets 224; Potassium 4.0; Sodium 136   Recent Lipid Panel Lab Results  Component Value Date/Time   CHOL 124 06/12/2018 10:44 AM   TRIG 187 (H) 06/12/2018 10:44 AM   HDL 34 (L) 06/12/2018 10:44 AM   CHOLHDL 3.6 06/12/2018 10:44 AM   LDLCALC 53 06/12/2018 10:44 AM    Wt Readings from Last 3 Encounters:  07/17/18 222 lb (100.7 kg)  05/03/18 232 lb 12.8 oz (105.6 kg)  11/15/17 219 lb 12.8 oz (99.7 kg)     Objective:    Vital Signs:  Ht 6' 1.5" (1.867 m)   BMI 28.89 kg/m    VITAL SIGNS:  reviewed GEN:  no acute distress RESPIRATORY:  normal respiratory effort, symmetric expansion CARDIOVASCULAR:  no peripheral edema NEURO:  alert and oriented x 3, no obvious focal deficit  ASSESSMENT & PLAN:    1. Chronic systolic heart failure (HCC)   2. Hyperlipidemia with target LDL less than 70   3. Coronary artery disease involving coronary bypass graft of native heart with angina pectoris (HCC)   4.  Encounter for long-term current use of medication   5. Educated About Covid-19 Virus Infection    PLAN:  1. No symptoms of heart failure.  Repeat echocardiogram before year end. 2. LDL target less than 70.  Lipid panel at end of year. 3. Secondary prevention discussed in detail. 4. He is tolerating current medication regimen without difficulty.  Electrolytes will be done before year end.  Overall education and awareness concerning primary/secondary risk prevention was discussed in detail: LDL less than 70, hemoglobin A1c less than 7, blood pressure target less than 130/80 mmHg, >150 minutes of moderate aerobic activity per week, avoidance of smoking, weight control (via diet and exercise), and continued surveillance/management of/for obstructive sleep apnea.   COVID-19 Education: The  signs and symptoms of COVID-19 were discussed with the patient and how to seek care for testing (follow up with PCP or arrange E-visit).  The importance of social distancing was discussed today.  Time:   Today, I have spent 15 minutes with the patient with telehealth technology discussing the above problems.     Medication Adjustments/Labs and Tests Ordered: Current medicines are reviewed at length with the patient today.  Concerns regarding medicines are outlined above.   Tests Ordered: No orders of the defined types were placed in this encounter.   Medication Changes: No orders of the defined types were placed in this encounter.   Follow Up:  In Person in 8 month(s)  Signed, Sinclair Grooms, MD  01/11/2019 9:55 AM    [place]

## 2019-01-10 NOTE — Telephone Encounter (Signed)
New Message ° ° ° ° °CONSENT FOR TELE-HEALTH VISIT - PLEASE REVIEW TO PATIENT ° °I hereby voluntarily request, consent and authorize [facility] and its employed or contracted physicians, physician assistants, nurse practitioners or other licensed health care professionals (the Practitioner), to provide me with telemedicine health care services (the "Services") as deemed necessary by the treating Practitioner. I acknowledge and consent to receive the Services by the Practitioner via telemedicine. I understand that the telemedicine visit will involve communicating with the Practitioner through live audiovisual communication technology and the disclosure of certain medical information by electronic transmission. I acknowledge that I have been given the opportunity to request an in-person assessment or other available alternative prior to the telemedicine visit and am voluntarily participating in the telemedicine visit. ° °I understand that I have the right to withhold or withdraw my consent to the use of telemedicine in the course of my care at any time, without affecting my right to future care or treatment, and that the Practitioner or I may terminate the ° °telemedicine visit at any time. I understand that I have the right to inspect all information obtained and/or recorded in the course of the telemedicine visit and may receive copies of available information for a reasonable fee. I understand that some of the potential risks of receiving the Services via telemedicine include: ° °Delay or interruption in medical evaluation due to technological equipment failure or disruption; ° °Information transmitted may not be sufficient (e.g. poor resolution of images) to allow for appropriate medical decision making by the Practitioner; and/or ° °In rare instances, security protocols could fail, causing a breach of personal health information. ° °Furthermore, I acknowledge that it is my responsibility to provide  information about my medical history, conditions and care that is complete and accurate to the best of my ability. I acknowledge that Practitioner's advice, recommendations, and/or decision may be based on factors not within their control, such as incomplete or inaccurate data provided by me or distortions of diagnostic images or specimens that may result from electronic transmissions. I understand that the practice of medicine is not an exact science and that Practitioner makes no warranties or guarantees regarding treatment outcomes. I acknowledge that I will receive a copy of this consent concurrently upon execution via email to the email address I last provided but may also request a printed copy by calling the office of [facility]. ° °I understand that my insurance will be billed for this visit. ° °I have read or had this consent read to me. ° °I understand the contents of this consent, which adequately explains the benefits and risks of the Services being provided via telemedicine. ° °I have been provided ample opportunity to ask questions regarding this consent and the Services and have had my questions answered to my satisfaction. ° °I give my informed consent for the services to be provided through the use of telemedicine in my medical care ° °By participating in this telemedicine visit I agree to the above °YES °

## 2019-01-11 ENCOUNTER — Telehealth (INDEPENDENT_AMBULATORY_CARE_PROVIDER_SITE_OTHER): Payer: Managed Care, Other (non HMO) | Admitting: Interventional Cardiology

## 2019-01-11 ENCOUNTER — Other Ambulatory Visit: Payer: Self-pay

## 2019-01-11 ENCOUNTER — Encounter: Payer: Self-pay | Admitting: *Deleted

## 2019-01-11 ENCOUNTER — Encounter: Payer: Self-pay | Admitting: Interventional Cardiology

## 2019-01-11 VITALS — BP 118/89 | HR 73 | Ht 73.5 in | Wt 230.0 lb

## 2019-01-11 DIAGNOSIS — E785 Hyperlipidemia, unspecified: Secondary | ICD-10-CM

## 2019-01-11 DIAGNOSIS — Z7189 Other specified counseling: Secondary | ICD-10-CM

## 2019-01-11 DIAGNOSIS — I5022 Chronic systolic (congestive) heart failure: Secondary | ICD-10-CM | POA: Diagnosis not present

## 2019-01-11 DIAGNOSIS — I25709 Atherosclerosis of coronary artery bypass graft(s), unspecified, with unspecified angina pectoris: Secondary | ICD-10-CM

## 2019-01-11 DIAGNOSIS — Z79899 Other long term (current) drug therapy: Secondary | ICD-10-CM

## 2019-01-11 NOTE — Patient Instructions (Signed)
Medication Instructions:   Your physician recommends that you continue on your current medications as directed. Please refer to the Current Medication list given to you today.  If you need a refill on your cardiac medications before your next appointment, please call your pharmacy.    Lab work:  YOUR LAB [place] OUT ON 02/12/19 AT 9:45 AM--WE WILL CHECK A CMET, CBC, AND LIPIDS--PLEASE COME FASTING TO THIS LAB APPOINTMENT.  If you have labs (blood work) drawn today and your tests are completely normal, you will receive your results only by: Marland Kitchen MyChart Message (if you have MyChart) OR . A paper copy in the mail If you have any lab test that is abnormal or we need to change your treatment, we will call you to review the results.   Testing/Procedures:  Your physician has requested that you have an echocardiogram. Echocardiography is a painless test that uses sound waves to create images of your heart. It provides your doctor with information about the size and shape of your heart and how well your heart's chambers and valves are working. This procedure takes approximately one hour. There are no restrictions for this procedure. DR. Tamala Julian WANTS YOUR ECHO TO BE SCHEDULED FOR THE END OF THIS YEAR--YOU WILL BE DUE FOR YOUR ECHO IN December 2020--OUR [place] DEPT/ECHO SCHEDULER GESILA WILL BE CALLING YOU TO SCHEDULE YOUR ECHO FOR SOMETIME IN December 2020.    Follow-Up: At [facility], you and your health needs are our priority.  As part of our continuing mission to provide you with exceptional heart care, we have created designated Provider Care Teams.  These Care Teams include your primary Cardiologist (physician) and Advanced Practice Providers (APPs -  Physician Assistants and Nurse Practitioners) who all work together to provide you with the care you need, when you need it.  Your physician wants you to follow-up in: [place] will receive a  reminder letter in the mail two months in advance. If you don't receive a letter, please call our office to schedule the follow-up appointment.

## 2019-02-12 ENCOUNTER — Other Ambulatory Visit: Payer: Managed Care, Other (non HMO)

## 2019-02-20 ENCOUNTER — Other Ambulatory Visit: Payer: Managed Care, Other (non HMO)

## 2019-02-26 ENCOUNTER — Other Ambulatory Visit: Payer: Managed Care, Other (non HMO)

## 2019-02-26 ENCOUNTER — Other Ambulatory Visit: Payer: Self-pay

## 2019-02-26 DIAGNOSIS — Z7189 Other specified counseling: Secondary | ICD-10-CM

## 2019-02-26 DIAGNOSIS — I5022 Chronic systolic (congestive) heart failure: Secondary | ICD-10-CM

## 2019-02-26 DIAGNOSIS — I25709 Atherosclerosis of coronary artery bypass graft(s), unspecified, with unspecified angina pectoris: Secondary | ICD-10-CM

## 2019-02-26 DIAGNOSIS — Z79899 Other long term (current) drug therapy: Secondary | ICD-10-CM

## 2019-02-26 DIAGNOSIS — E785 Hyperlipidemia, unspecified: Secondary | ICD-10-CM

## 2019-02-26 LAB — COMPREHENSIVE METABOLIC PANEL
ALT: 24 IU/L (ref 0–44)
AST: 21 IU/L (ref 0–40)
Albumin/Globulin Ratio: 1.7 (ref 1.2–2.2)
Albumin: 4.4 g/dL (ref 4.0–5.0)
Alkaline Phosphatase: 59 IU/L (ref 39–117)
BUN/Creatinine Ratio: 10 (ref 9–20)
BUN: 13 mg/dL (ref 6–24)
Bilirubin Total: 0.7 mg/dL (ref 0.0–1.2)
CO2: 20 mmol/L (ref 20–29)
Calcium: 9.4 mg/dL (ref 8.7–10.2)
Chloride: 100 mmol/L (ref 96–106)
Creatinine, Ser: 1.25 mg/dL (ref 0.76–1.27)
GFR calc Af Amer: 82 mL/min/{1.73_m2} (ref >59)
GFR calc non Af Amer: 71 mL/min/{1.73_m2} (ref >59)
Globulin, Total: 2.6 g/dL (ref 1.5–4.5)
Glucose: 98 mg/dL (ref 65–99)
Potassium: 4.3 mmol/L (ref 3.5–5.2)
Sodium: 137 mmol/L (ref 134–144)
Total Protein: 7 g/dL (ref 6.0–8.5)

## 2019-02-26 LAB — LIPID PANEL
Chol/HDL Ratio: 3.6 ratio (ref 0.0–5.0)
Cholesterol, Total: 127 mg/dL (ref 100–199)
HDL: 35 mg/dL — ABNORMAL LOW (ref >39)
LDL Calculated: 61 mg/dL (ref 0–99)
Triglycerides: 154 mg/dL — ABNORMAL HIGH (ref 0–149)
VLDL Cholesterol Cal: 31 mg/dL (ref 5–40)

## 2019-02-26 LAB — CBC
Hematocrit: 51.8 % — ABNORMAL HIGH (ref 37.5–51.0)
Hemoglobin: 17.7 g/dL (ref 13.0–17.7)
MCH: 27.2 pg (ref 26.6–33.0)
MCHC: 34.2 g/dL (ref 31.5–35.7)
MCV: 80 fL (ref 79–97)
Platelets: 226 10*3/uL (ref 150–450)
RBC: 6.51 x10E6/uL — ABNORMAL HIGH (ref 4.14–5.80)
RDW: 13.5 % (ref 11.6–15.4)
WBC: 6.7 10*3/uL (ref 3.4–10.8)

## 2019-03-01 ENCOUNTER — Other Ambulatory Visit: Payer: Self-pay | Admitting: *Deleted

## 2019-03-01 MED ORDER — VASCEPA 1 G PO CAPS: 2.0000 g | ORAL_CAPSULE | Freq: Two times a day (BID) | ORAL | 11 refills | Status: DC

## 2019-05-21 ENCOUNTER — Other Ambulatory Visit: Payer: Self-pay | Admitting: Interventional Cardiology

## 2019-06-01 ENCOUNTER — Other Ambulatory Visit: Payer: Self-pay | Admitting: Interventional Cardiology

## 2019-07-08 ENCOUNTER — Ambulatory Visit (HOSPITAL_COMMUNITY): Payer: Managed Care, Other (non HMO) | Attending: Cardiovascular Disease

## 2019-07-08 ENCOUNTER — Other Ambulatory Visit: Payer: Self-pay

## 2019-07-08 DIAGNOSIS — I5022 Chronic systolic (congestive) heart failure: Secondary | ICD-10-CM

## 2019-07-08 MED ORDER — PERFLUTREN LIPID MICROSPHERE
1.0000 mL | INTRAVENOUS | Status: AC | PRN
  Administered 2019-07-08: 2 mL via INTRAVENOUS

## 2019-09-03 ENCOUNTER — Other Ambulatory Visit: Payer: Self-pay | Admitting: Interventional Cardiology

## 2019-10-02 ENCOUNTER — Other Ambulatory Visit: Payer: Self-pay | Admitting: Interventional Cardiology

## 2019-10-02 MED ORDER — METOPROLOL SUCCINATE ER 50 MG PO TB24: 50.0000 mg | ORAL_TABLET | Freq: Every day | ORAL | 0 refills | Status: DC

## 2019-10-02 MED ORDER — ATORVASTATIN CALCIUM 40 MG PO TABS: 40.0000 mg | ORAL_TABLET | Freq: Every day | ORAL | 0 refills | Status: DC

## 2019-10-02 NOTE — Addendum Note (Signed)
Addended by: Demetrios Loll on: 10/02/2019 05:25 PM   Modules accepted: Orders

## 2019-11-27 ENCOUNTER — Other Ambulatory Visit: Payer: Self-pay | Admitting: Interventional Cardiology

## 2019-11-28 IMAGING — CT CT ANGIO CHEST
2 of 6 series · 18 of 36 positions shown · IV contrast (Omni 300)
Comparison: Plain films of earlier today.

CLINICAL DATA: Syncopal episode while using the bathroom. CABG
approximately 3 weeks ago.

EXAM:
CT ANGIOGRAPHY CHEST WITH CONTRAST
TECHNIQUE: Multidetector CT imaging of the chest was performed using the
standard protocol during bolus administration of intravenous
contrast. Multiplanar CT image reconstructions and MIPs were
obtained to evaluate the vascular anatomy.
CONTRAST:  <See Chart> [id number] IOPAMIDOL ([id number]) INJECTION
76%

[Series 7: pe thins · axial · 0.83mm/px · z∈[+1118,+1350]mm · 17 of 262 slices shown]
[im 15/262  lung]
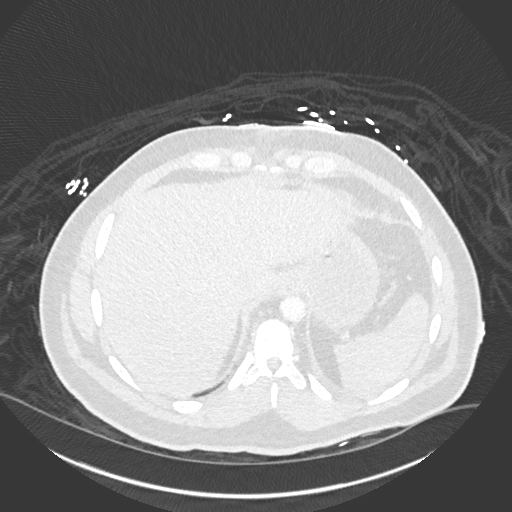
[im 30/262  mediastinal]
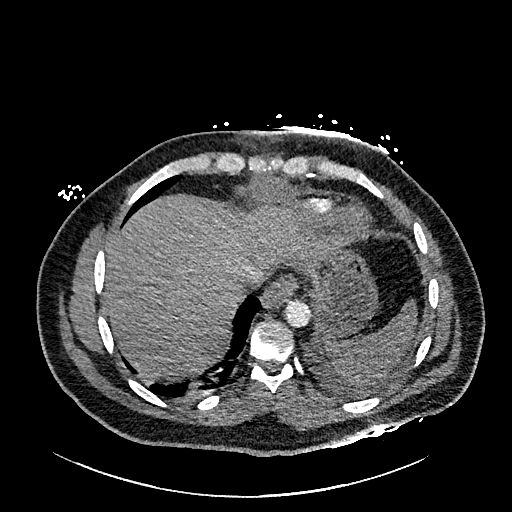
[im 44/262  lung]
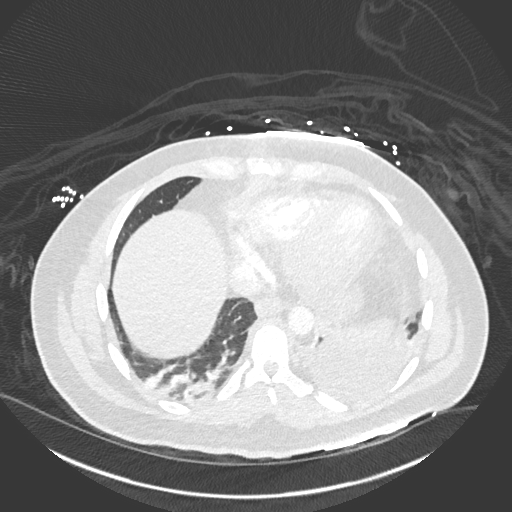
[im 59/262  mediastinal]
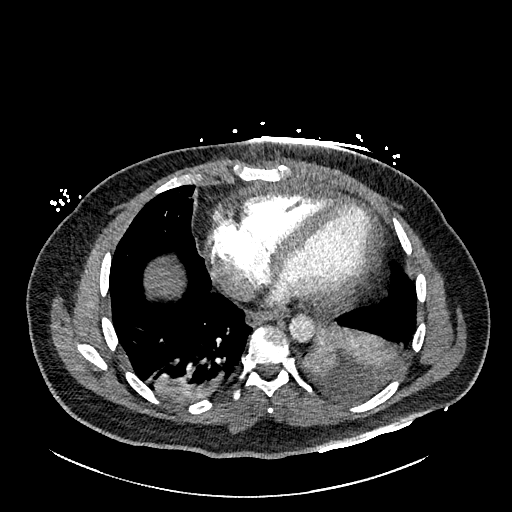
[im 73/262  lung]
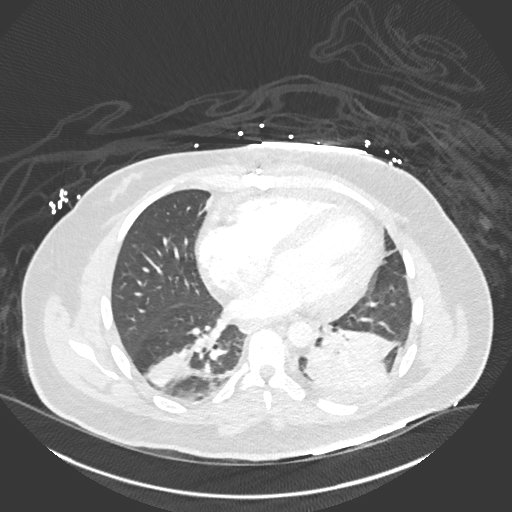
[im 88/262  mediastinal]
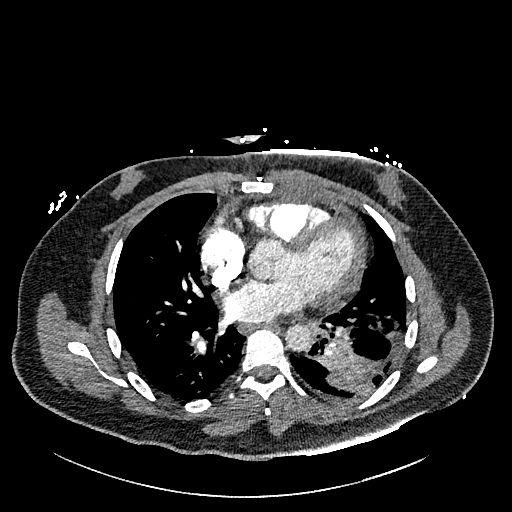
[im 102/262  lung]
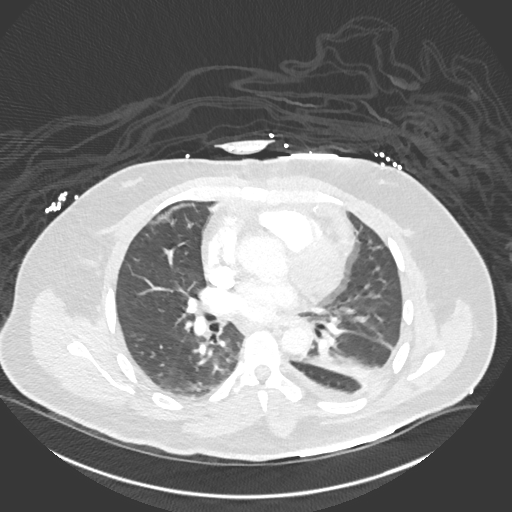
[im 117/262  mediastinal]
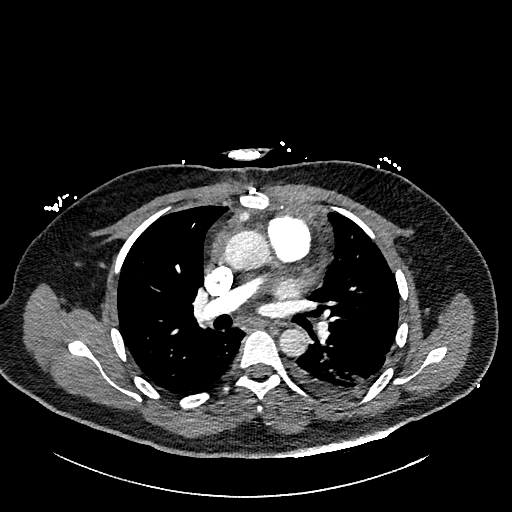
[im 131/262  lung]
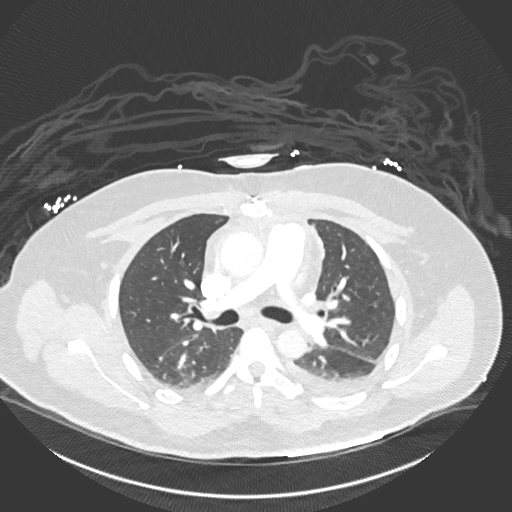
[im 146/262  mediastinal]
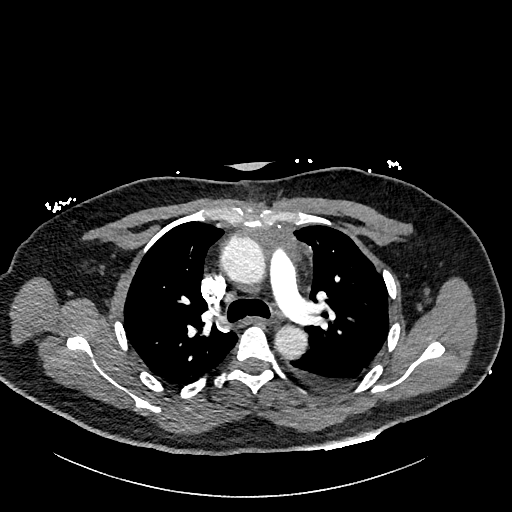
[im 160/262  lung]
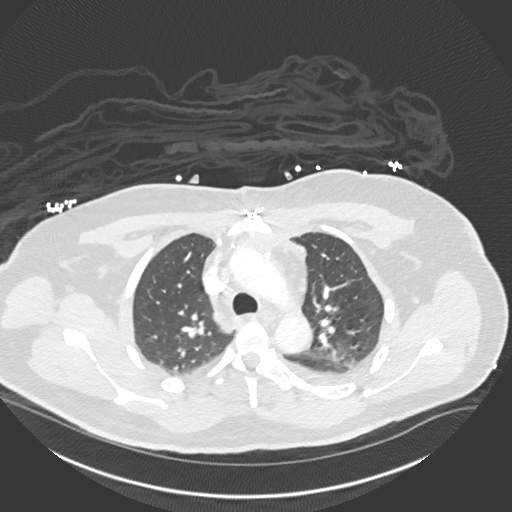
[im 175/262  mediastinal]
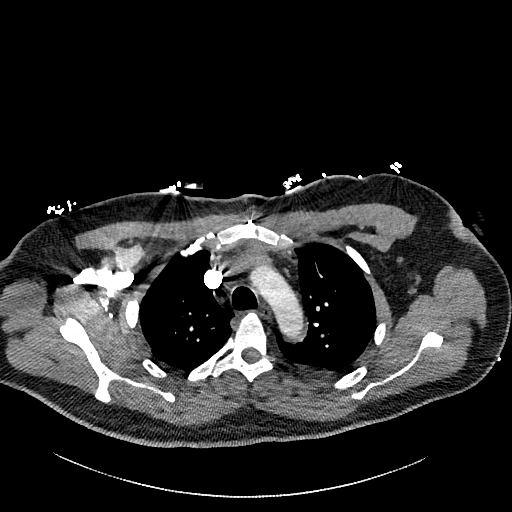
[im 189/262  lung]
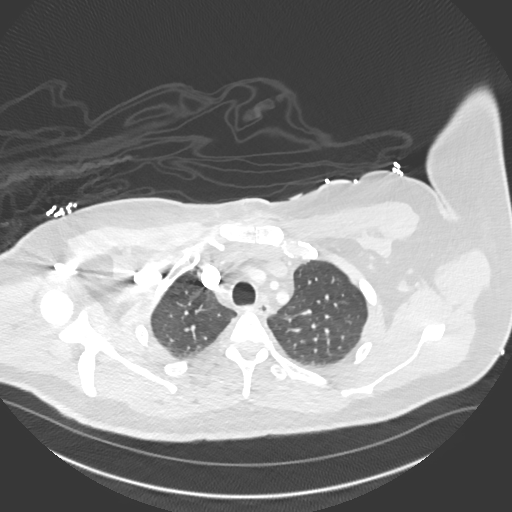
[im 204/262  mediastinal]
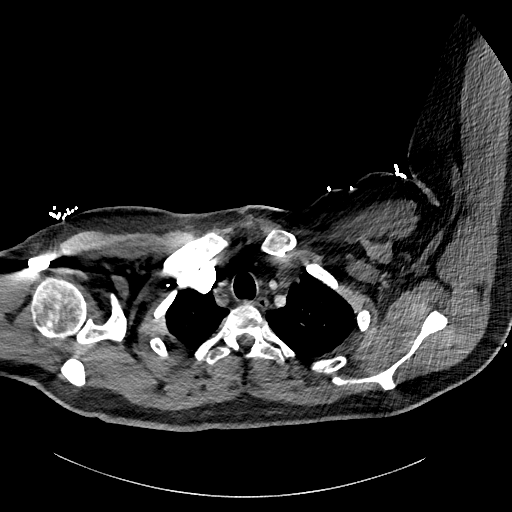
[im 218/262  lung]
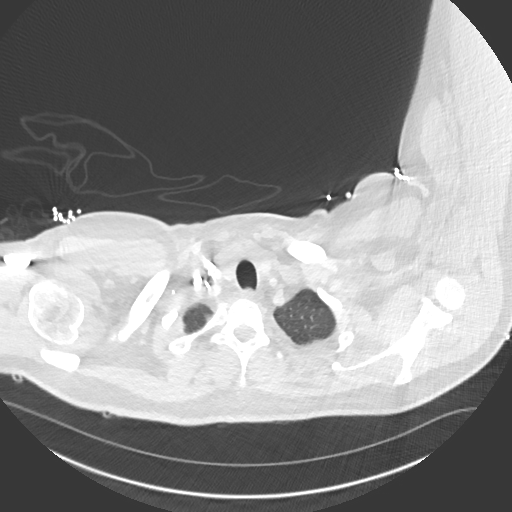
[im 233/262  mediastinal]
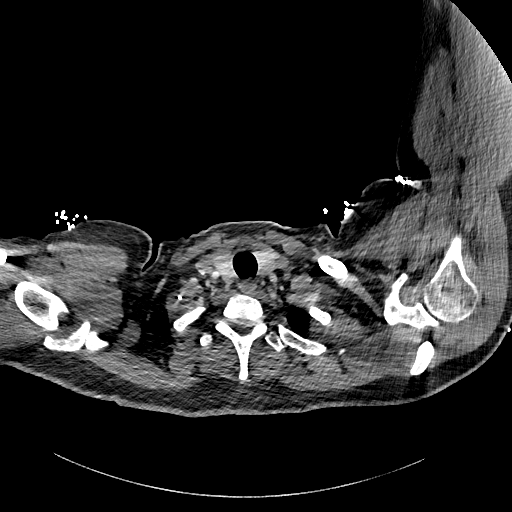
[im 247/262  lung]
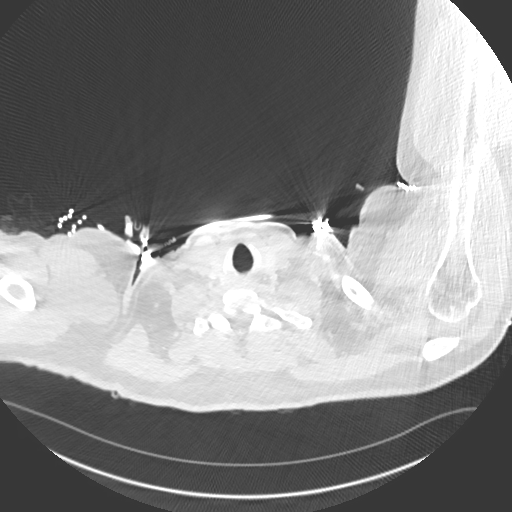

[Series 8: pe 2mm cor · coronal · 0.57mm/px · 1 of 151 slices shown]
[im 76/151  mediastinal]
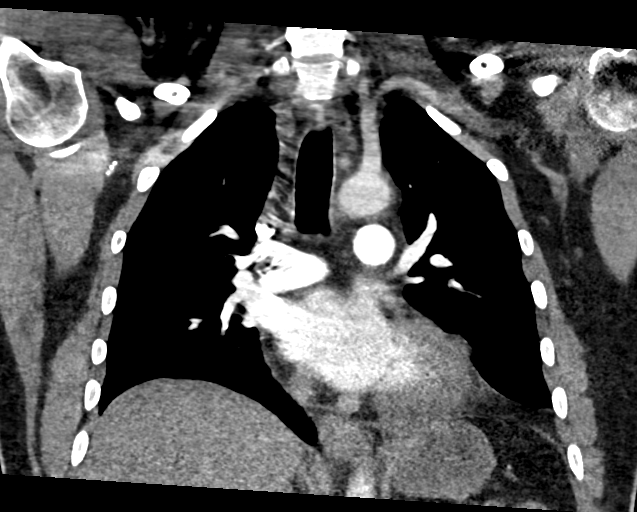

[18 of 36 positions shown; findings below may reference images not displayed]

FINDINGS: Cardiovascular: The quality of this exam for evaluation of pulmonary
embolism is moderate. Limitations include mild motion and suboptimal
bolus timing, with the bolus centered in the SVC. No pulmonary
embolism to the lobar or large segmental level. Smaller emboli
cannot be excluded.

Normal aortic caliber. Mild cardiomegaly. Small pericardial effusion
is likely physiologic. Status post median sternotomy. Retrosternal
small volume relatively ill-defined edema and fluid, including on
image 56/series 5. No well-defined fluid collection.

Mediastinum/Nodes: No mediastinal or hilar adenopathy. Tiny hiatal
hernia.

Lungs/Pleura: Small left pleural effusion with minimal loculation
including on image 106/series 5. No pneumothorax.

Left worse than right base airspace disease.

Upper Abdomen: Normal imaged portions of the liver, spleen, right
adrenal gland.

Musculoskeletal: Mild bilateral gynecomastia.

Review of the MIP images confirms the above findings.
IMPRESSION: 1. Moderate quality exam for evaluation of pulmonary embolism. No
pulmonary embolism with limitations above.
2. Small left pleural effusion with mild loculation. Bibasilar
airspace disease. On the right, favored to represent atelectasis. On
the left, this could represent atelectasis or mild infection.
3. Median sternotomy with ill-defined retrosternal fluid and edema,
favored to be within normal variation in the postoperative state. No
well-defined fluid collection.
4. Mild gynecomastia.
5.  Tiny hiatal hernia.

## 2019-11-28 IMAGING — DX DG CHEST 1V PORT
1 series · 1 of 1 positions shown · non-contrast
Comparison: None.

CLINICAL DATA: Syncope and weakness. Recent coronary bypass
surgery.

EXAM:
PORTABLE CHEST 1 VIEW

[chest ap]
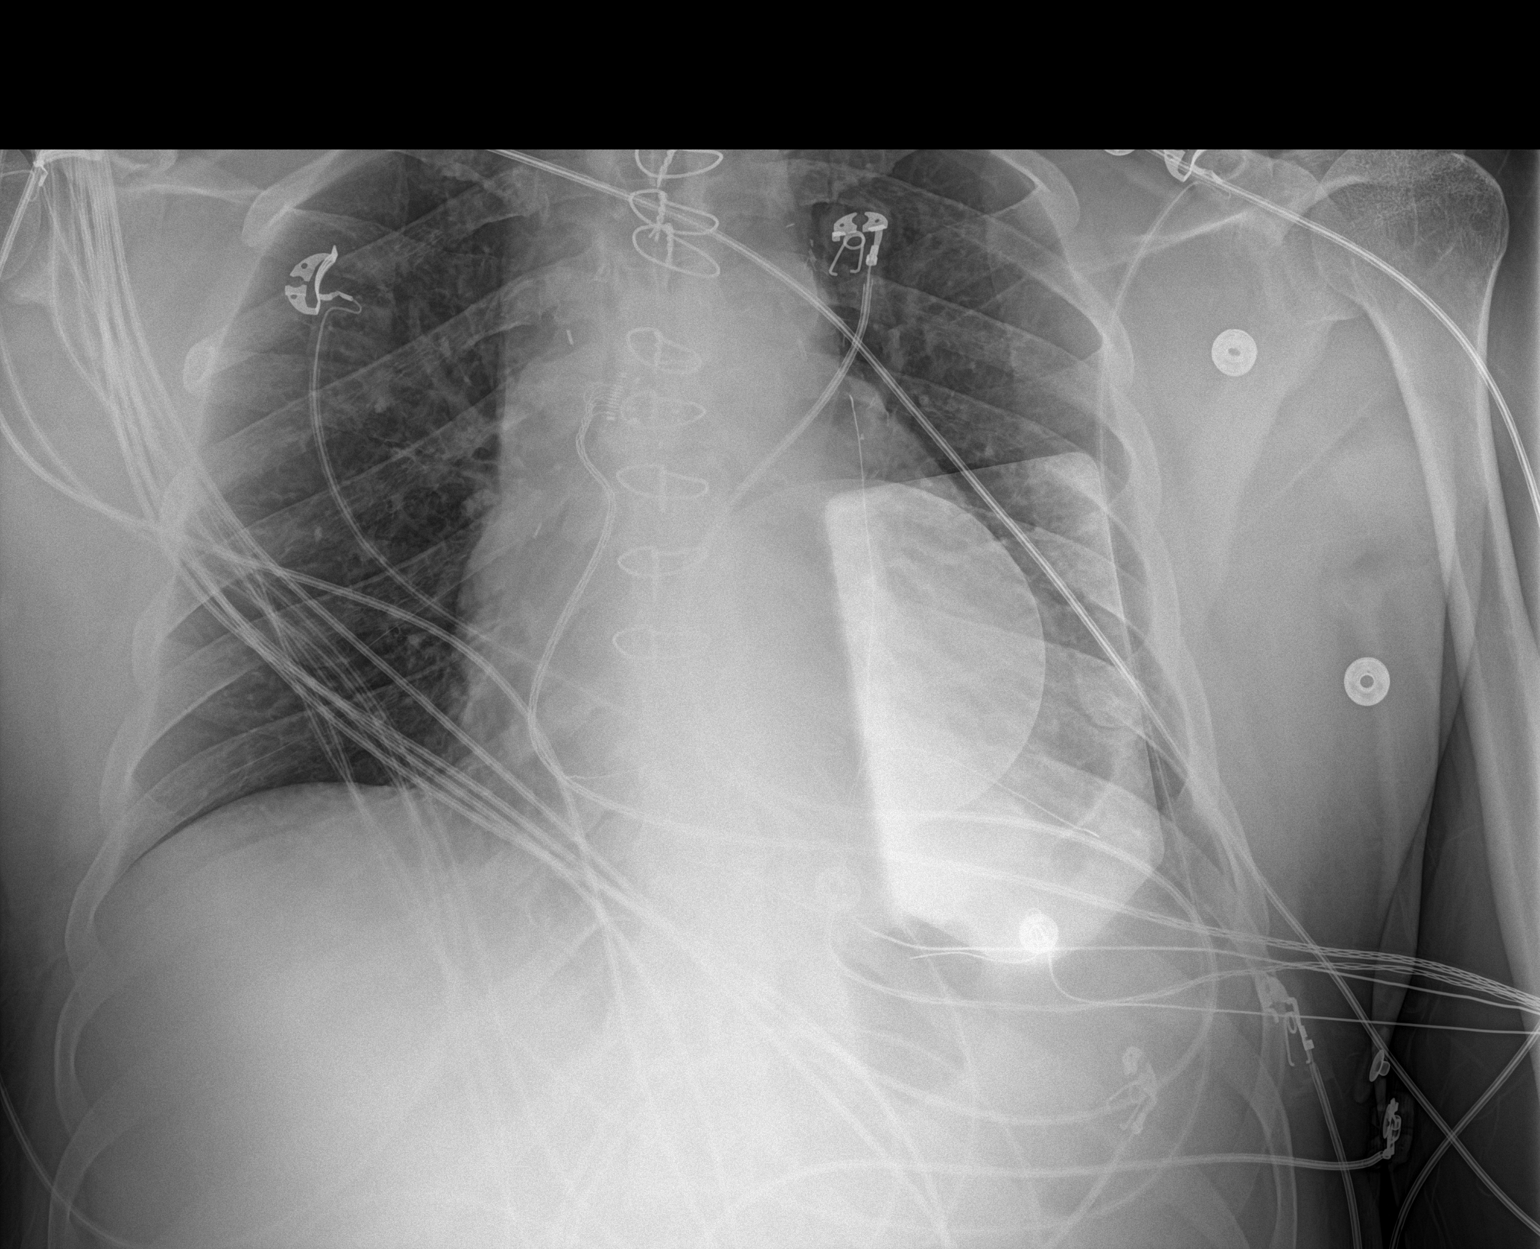

[1 of 1 positions shown; findings below may reference images not displayed]

FINDINGS: Postoperative changes in the mediastinum. Shallow inspiration.
Cardiac enlargement. No vascular congestion or edema. No blunting of
costophrenic angles. No pneumothorax. No focal consolidation or
airspace disease.
IMPRESSION: Cardiac enlargement.  No evidence of active pulmonary disease.

## 2019-12-03 ENCOUNTER — Other Ambulatory Visit: Payer: Self-pay | Admitting: Interventional Cardiology

## 2019-12-03 MED ORDER — ENTRESTO 24-26 MG PO TABS: 1.0000 | ORAL_TABLET | Freq: Two times a day (BID) | ORAL | 0 refills | Status: DC

## 2019-12-17 NOTE — Progress Notes (Signed)
Cardiology Office Note:    Date:  12/19/2019   ID:  Brad Lee, DOB 11-17-1975, MRN [id number]  PCP:  Henrine Screws, MD  Cardiologist:  Lesleigh Noe, MD   Referring MD: Henrine Screws, MD   Chief Complaint  Patient presents with  . Coronary Artery Disease  . Congestive Heart Failure    History of Present Illness:    Brad Lee is a 44 y.o. male with a hx of anterior myocardial infarction January 2019, acute systolic heart failure, ultimate multivessel coronary bypass grafting January 2019, history of syncope related to medication and overdiuresis, family history CAD, and hyperlipidemia.  Jarratt feels well.  He has not had angina or heart failure symptoms.  No dizziness, syncope, or palpitations.  He is compliant with medications.  His work and family schedule have prevented exercise and is also placed challenges on dieting.  He has therefore gained weight and not been able to exercise as much as he would like.  We spent considerable time discussing this.  There are no other significant complaints.  He does not have claudication.  He has not had blood in his urine or stool.  Past Medical History:  Diagnosis Date  . CAD (coronary artery disease)    a. 07/2017 s/p Ant STEMI/Cath [facility], [place]): LAD 100->attempt to open LAD failed, high grade dzs in LCX/RCA--->CABG x 5 (LIMA->LAD, VG->Diag, VG->OM, VG->PDA->RPL.  Marland Kitchen HFrEF (heart failure with reduced ejection fraction) (HCC)    a. 07/29/2017 Echo: EF 45%; b. 08/06/2017 TEE: EF 30-35%.  . Ischemic cardiomyopathy    a. 07/29/2017 Echo: EF 45%; b. 08/06/2017 TEE: EF 30-35%-->LifeVest placed following CABG.  . LVH (left ventricular hypertrophy)   . Mitral regurgitation   . Respiratory failure, acute (HCC)   . 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 Syncope     Past Surgical History:  Procedure Laterality Date  . CORONARY ARTERY BYPASS GRAFT  07/28/2017  . TONSILLECTOMY      Current Medications: Current Meds  Medication Sig  .  albuterol (PROVENTIL HFA;VENTOLIN HFA) 108 (90 Base) MCG/ACT inhaler Inhale 2 puffs into the lungs every 6 (six) hours as needed for wheezing or shortness of breath.  . ASPIRIN LOW DOSE 81 MG chewable tablet CHEW 1 TABLET (81 MG TOTAL) BY MOUTH DAILY.  Marland Kitchen atorvastatin (LIPITOR) 40 MG tablet TAKE 1 TABLET BY MOUTH EVERY DAY  . calcium carbonate (TUMS - DOSED IN MG ELEMENTAL CALCIUM) 500 MG chewable tablet Chew 1 tablet by mouth daily as needed for indigestion or heartburn.   . cetirizine (ZYRTEC) 10 MG tablet Take 10 mg by mouth daily as needed for allergies.   . famotidine (PEPCID) 10 MG tablet Take 10 mg by mouth daily as needed for heartburn or indigestion.   . fluticasone (FLONASE) 50 MCG/ACT nasal spray Place 1 spray into both nostrils daily as needed for allergies.   . metoprolol succinate (TOPROL-XL) 50 MG 24 hr tablet Take 1 tablet (50 mg total) by mouth daily. Take with or immediately following a meal. Please keep upcoming appt in June with Dr. Katrinka Blazing. Thank you  . Multiple Vitamin (MULTIVITAMIN) tablet Take 1 tablet by mouth daily.  . nitroGLYCERIN (NITROSTAT) 0.4 MG SL tablet Place 1 tablet (0.4 mg total) under the tongue every 5 (five) minutes x 3 doses as needed for chest pain.  . sacubitril-valsartan (ENTRESTO) 24-26 MG Take 1 tablet by mouth 2 (two) times daily. Please keep upcoming appt in June with Dr. Katrinka Blazing for future refills. Thank  you  . VASCEPA 1 g capsule TAKE 2 CAPSULES (2 G TOTAL) BY MOUTH 2 (TWO) TIMES DAILY.     Allergies:   Augmentin [amoxicillin-pot clavulanate]   Social History   Socioeconomic History  . Marital status: Married    Spouse name: Not on file  . Number of children: 0  . Years of education: Not on file  . Highest education level: Not on file  Occupational History  . Occupation: OPERATIONS MANAGER  Tobacco Use  . Smoking status: Never Smoker  . Smokeless tobacco: Never Used  Substance and Sexual Activity  . Alcohol use: No  . Drug use: No  . Sexual  activity: Not Currently  Other Topics Concern  . Not on file  Social History Narrative  . Not on file   Social Determinants of Health   Financial Resource Strain:   . Difficulty of Paying Living Expenses:   Food Insecurity:   . Worried About Programme researcher, broadcasting/film/video in the Last Year:   . Barista in the Last Year:   Transportation Needs:   . Freight forwarder (Medical):   Marland Kitchen Lack of Transportation (Non-Medical):   Physical Activity:   . Days of Exercise per Week:   . Minutes of Exercise per Session:   Stress:   . Feeling of Stress :   Social Connections:   . Frequency of Communication with Friends and Family:   . Frequency of Social Gatherings with Friends and Family:   . Attends Religious Services:   . Active Member of Clubs or Organizations:   . Attends Banker Meetings:   Marland Kitchen Marital Status:      Family History: The patient's family history includes Healthy in his father and mother.  ROS:   Please see the history of present illness.    Denies chest pain.  Nitroglycerin use has not been necessary.  He is not sleeping well but this is related to work schedule and trying to take care of his 61-year-old daughter.  His wife's work schedule is even more challenging than his.  All other systems reviewed and are negative.  EKGs/Labs/Other Studies Reviewed:    The following studies were reviewed today: No new data  EKG:  EKG normal sinus rhythm, extreme right axis deviation, Q waves I, aVL, V1 through V6.  No acute changes are noted.  Recent Labs: 02/26/2019: ALT 24; BUN 13; Creatinine, Ser 1.25; Hemoglobin 17.7; Platelets 226; Potassium 4.3; Sodium 137  Recent Lipid Panel    Component Value Date/Time   CHOL 127 02/26/2019 0943   TRIG 154 (H) 02/26/2019 0943   HDL 35 (L) 02/26/2019 0943   CHOLHDL 3.6 02/26/2019 0943   LDLCALC 61 02/26/2019 0943    Physical Exam:    VS:  BP 116/76   Pulse 68   Ht 6' 1.5" (1.867 m)   Wt 228 lb (103.4 kg)   SpO2 97%    BMI 29.67 kg/m     Wt Readings from Last 3 Encounters:  12/19/19 228 lb (103.4 kg)  01/11/19 230 lb (104.3 kg)  07/17/18 222 lb (100.7 kg)     GEN: Mildly overweight. No acute distress HEENT: Normal NECK: No JVD. LYMPHATICS: No lymphadenopathy CARDIAC:  RRR without murmur, gallop, or edema. VASCULAR:  Normal Pulses. No bruits. RESPIRATORY:  Clear to auscultation without rales, wheezing or rhonchi  ABDOMEN: Soft, non-tender, non-distended, No pulsatile mass, MUSCULOSKELETAL: No deformity  SKIN: Warm and dry NEUROLOGIC:  Alert and oriented x 3  PSYCHIATRIC:  Normal affect   ASSESSMENT:    1. Chronic systolic heart failure (HCC)   2. Hyperlipidemia with target LDL less than 70   3. Coronary artery disease involving coronary bypass graft of native heart with angina pectoris (HCC)   4. Educated about COVID-19 virus infection    PLAN:    In order of problems listed above:  1. Heart failure regimen should be continued and includes Toprol-XL 50 mg daily and Entresto 24/26 mg daily.  We will consider performing a 2D Doppler echocardiogram early next year.  He is not currently requiring diuretic therapy.  The concept of MRA and SGLT2 therapy are being considered and will be instituted if there is loss in LV function or symptoms.   2. LDL target less than 70.  Most recent was 52 on Lipitor 40 and Vascepa 1 g twice daily.  Triglyceride 204 in April 2021. 3. Secondary prevention discussed. 4. Vaccinated with the Pfizer mRNA vaccine.  He has not developed COVID-19 disease.  Overall education and awareness concerning primary/secondary risk prevention was discussed in detail: LDL less than 70, hemoglobin A1c less than 7, blood pressure target less than 130/80 mmHg, >150 minutes of moderate aerobic activity per week, avoidance of smoking, weight control (via diet and exercise), and continued surveillance/management of/for obstructive sleep apnea.   At office visit in 1 year, discussed  repeating 2D Doppler echocardiogram to reassess LV function on current medical regimen.  He will have a basic metabolic panel performed towards the end of the year to reevaluate kidney function on Entresto.   Medication Adjustments/Labs and Tests Ordered: Current medicines are reviewed at length with the patient today.  Concerns regarding medicines are outlined above.  Orders Placed This Encounter  Procedures  . Basic metabolic panel  . Lipid panel  . EKG 12-Lead   No orders of the defined types were placed in this encounter.   Patient Instructions  Medication Instructions:  Your physician recommends that you continue on your current medications as directed. Please refer to the Current Medication list given to you today.  *If you need a refill on your cardiac medications before your next appointment, please call your pharmacy*   Lab Work: BMET towards the end of the year  Fasting Lipid panel prior to seeing Dr. Katrinka Blazing back  If you have labs (blood work) drawn today and your tests are completely normal, you will receive your results only by: Marland Kitchen MyChart Message (if you have MyChart) OR . A paper copy in the mail If you have any lab test that is abnormal or we need to change your treatment, we will call you to review the results.   Testing/Procedures: None   Follow-Up: At [facility], you and your health needs are our priority.  As part of our continuing mission to provide you with exceptional heart care, we have created designated Provider Care Teams.  These Care Teams include your primary Cardiologist (physician) and Advanced Practice Providers (APPs -  Physician Assistants and Nurse Practitioners) who all work together to provide you with the care you need, when you need it.  We recommend signing up for the patient portal called "MyChart".  Sign up information is provided on this After Visit Summary.  MyChart is used to connect with patients for Virtual Visits (Telemedicine).   Patients are able to view lab/test results, encounter notes, upcoming appointments, etc.  Non-urgent messages can be sent to your provider as well.   To learn more about what you  can do with MyChart, go to [email or web address].    Your next appointment:   12 month(s)  The format for your next appointment:   In Person  Provider:   You may see Sinclair Grooms, MD or one of the following Advanced Practice Providers on your designated Care Team:    Truitt Merle, NP  Cecilie Kicks, NP  Kathyrn Drown, NP    Other Instructions      Signed, Sinclair Grooms, MD  12/19/2019 5:11 PM    [place]

## 2019-12-19 ENCOUNTER — Ambulatory Visit: Payer: Managed Care, Other (non HMO) | Admitting: Interventional Cardiology

## 2019-12-19 ENCOUNTER — Encounter: Payer: Self-pay | Admitting: Interventional Cardiology

## 2019-12-19 ENCOUNTER — Other Ambulatory Visit: Payer: Self-pay

## 2019-12-19 VITALS — BP 116/76 | HR 68 | Ht 73.5 in | Wt 228.0 lb

## 2019-12-19 DIAGNOSIS — I5022 Chronic systolic (congestive) heart failure: Secondary | ICD-10-CM

## 2019-12-19 DIAGNOSIS — E785 Hyperlipidemia, unspecified: Secondary | ICD-10-CM

## 2019-12-19 DIAGNOSIS — I25709 Atherosclerosis of coronary artery bypass graft(s), unspecified, with unspecified angina pectoris: Secondary | ICD-10-CM | POA: Diagnosis not present

## 2019-12-19 DIAGNOSIS — Z7189 Other specified counseling: Secondary | ICD-10-CM

## 2019-12-19 NOTE — Patient Instructions (Signed)
Medication Instructions:  Your physician recommends that you continue on your current medications as directed. Please refer to the Current Medication list given to you today.  *If you need a refill on your cardiac medications before your next appointment, please call your pharmacy*   Lab Work: BMET towards the end of the year  Fasting Lipid panel prior to seeing Dr. Katrinka Blazing back  If you have labs (blood work) drawn today and your tests are completely normal, you will receive your results only by: Marland Kitchen MyChart Message (if you have MyChart) OR . A paper copy in the mail If you have any lab test that is abnormal or we need to change your treatment, we will call you to review the results.   Testing/Procedures: None   Follow-Up: At [facility], you and your health needs are our priority.  As part of our continuing mission to provide you with exceptional heart care, we have created designated Provider Care Teams.  These Care Teams include your primary Cardiologist (physician) and Advanced Practice Providers (APPs -  Physician Assistants and Nurse Practitioners) who all work together to provide you with the care you need, when you need it.  We recommend signing up for the patient portal called "MyChart".  Sign up information is provided on this After Visit Summary.  MyChart is used to connect with patients for Virtual Visits (Telemedicine).  Patients are able to view lab/test results, encounter notes, upcoming appointments, etc.  Non-urgent messages can be sent to your provider as well.   To learn more about what you can do with MyChart, go to [email or web address].    Your next appointment:   12 month(s)  The format for your next appointment:   In Person  Provider:   You may see Lesleigh Noe, MD or one of the following Advanced Practice Providers on your designated Care Team:    Norma Fredrickson, NP  Nada Boozer, NP  Georgie Chard, NP    Other Instructions

## 2020-01-25 ENCOUNTER — Other Ambulatory Visit: Payer: Self-pay | Admitting: Interventional Cardiology

## 2020-03-03 ENCOUNTER — Other Ambulatory Visit: Payer: Self-pay

## 2020-03-03 MED ORDER — ENTRESTO 24-26 MG PO TABS: 1.0000 | ORAL_TABLET | Freq: Two times a day (BID) | ORAL | 3 refills | Status: DC

## 2020-03-04 ENCOUNTER — Other Ambulatory Visit: Payer: Self-pay | Admitting: Interventional Cardiology

## 2020-03-10 ENCOUNTER — Other Ambulatory Visit: Payer: Self-pay | Admitting: Interventional Cardiology

## 2020-06-19 ENCOUNTER — Other Ambulatory Visit: Payer: Self-pay

## 2020-06-19 ENCOUNTER — Other Ambulatory Visit: Payer: Managed Care, Other (non HMO) | Admitting: *Deleted

## 2020-06-19 DIAGNOSIS — I5022 Chronic systolic (congestive) heart failure: Secondary | ICD-10-CM

## 2020-06-19 LAB — BASIC METABOLIC PANEL
BUN/Creatinine Ratio: 12 (ref 9–20)
BUN: 13 mg/dL (ref 6–24)
CO2: 22 mmol/L (ref 20–29)
Calcium: 9.1 mg/dL (ref 8.7–10.2)
Chloride: 103 mmol/L (ref 96–106)
Creatinine, Ser: 1.1 mg/dL (ref 0.76–1.27)
GFR calc Af Amer: 95 mL/min/{1.73_m2} (ref >59)
GFR calc non Af Amer: 82 mL/min/{1.73_m2} (ref >59)
Glucose: 98 mg/dL (ref 65–99)
Potassium: 4 mmol/L (ref 3.5–5.2)
Sodium: 138 mmol/L (ref 134–144)

## 2020-08-12 NOTE — Telephone Encounter (Signed)
Thanks for the advice.  Agree with recommendation.

## 2020-09-21 ENCOUNTER — Other Ambulatory Visit: Payer: Self-pay | Admitting: Interventional Cardiology

## 2021-01-18 ENCOUNTER — Other Ambulatory Visit: Payer: Self-pay | Admitting: Interventional Cardiology

## 2021-01-27 ENCOUNTER — Other Ambulatory Visit: Payer: Self-pay | Admitting: Interventional Cardiology

## 2021-02-05 ENCOUNTER — Other Ambulatory Visit: Payer: Self-pay | Admitting: Interventional Cardiology

## 2021-02-21 ENCOUNTER — Other Ambulatory Visit: Payer: Self-pay | Admitting: Interventional Cardiology

## 2021-02-22 ENCOUNTER — Other Ambulatory Visit: Payer: Self-pay | Admitting: Interventional Cardiology

## 2021-03-21 ENCOUNTER — Other Ambulatory Visit: Payer: Self-pay | Admitting: Interventional Cardiology

## 2021-03-22 ENCOUNTER — Other Ambulatory Visit: Payer: Self-pay | Admitting: Interventional Cardiology

## 2021-03-24 MED ORDER — ENTRESTO 24-26 MG PO TABS: 1.0000 | ORAL_TABLET | Freq: Two times a day (BID) | ORAL | 0 refills | Status: DC

## 2021-03-28 ENCOUNTER — Other Ambulatory Visit: Payer: Self-pay | Admitting: Interventional Cardiology

## 2021-04-06 ENCOUNTER — Other Ambulatory Visit: Payer: Self-pay | Admitting: Interventional Cardiology

## 2021-04-13 ENCOUNTER — Telehealth: Payer: Self-pay

## 2021-04-13 ENCOUNTER — Other Ambulatory Visit: Payer: Self-pay | Admitting: Interventional Cardiology

## 2021-04-13 NOTE — Telephone Encounter (Signed)
**Note De-Identified Tonika Eden Obfuscation** I called [facility] and was provided the pts new ins info from his [facility] card (prior ins was [employer]) as follows: ID: [id number] BIN: [id number] PCN:[id number]: [id number]  I started a Vascepa PA through covermymeds. Key: [id number]

## 2021-04-14 ENCOUNTER — Other Ambulatory Visit: Payer: Self-pay | Admitting: Interventional Cardiology

## 2021-04-14 ENCOUNTER — Telehealth: Payer: Self-pay | Admitting: Interventional Cardiology

## 2021-04-14 NOTE — Telephone Encounter (Signed)
Pt's medication was sent to pt's pharmacy as requested. Confirmation received.  °

## 2021-04-14 NOTE — Telephone Encounter (Signed)
*  STAT* If patient is at the pharmacy, call can be transferred to refill team.   1. Which medications need to be refilled? (please list name of each medication and dose if known)  atorvastatin (LIPITOR) 40 MG tablet  2. Which pharmacy/location (including street and city if local pharmacy) is medication to be sent to? [id number] [place], [place] - [address]  3. Do they need a 30 day or 90 day supply? 30 with refills   Patient is scheduled to see Jacolyn Reedy 04/20/21.  Patient is out of medication

## 2021-04-15 NOTE — Telephone Encounter (Signed)
**Note De-Identified Shervon Kerwin Obfuscation** Letter received Annajulia Lewing fax from [facility] stating that Vascepa does not require a PA at this time and that a refill was requested too soon.

## 2021-04-20 ENCOUNTER — Encounter: Payer: Self-pay | Admitting: Physician Assistant

## 2021-04-20 ENCOUNTER — Other Ambulatory Visit: Payer: Self-pay

## 2021-04-20 ENCOUNTER — Ambulatory Visit: Payer: Managed Care, Other (non HMO) | Admitting: Physician Assistant

## 2021-04-20 VITALS — BP 128/80 | HR 65 | Ht 73.5 in | Wt 235.4 lb

## 2021-04-20 DIAGNOSIS — I5022 Chronic systolic (congestive) heart failure: Secondary | ICD-10-CM | POA: Diagnosis not present

## 2021-04-20 DIAGNOSIS — E785 Hyperlipidemia, unspecified: Secondary | ICD-10-CM

## 2021-04-20 DIAGNOSIS — I255 Ischemic cardiomyopathy: Secondary | ICD-10-CM

## 2021-04-20 DIAGNOSIS — I25709 Atherosclerosis of coronary artery bypass graft(s), unspecified, with unspecified angina pectoris: Secondary | ICD-10-CM | POA: Diagnosis not present

## 2021-04-20 NOTE — Progress Notes (Signed)
Cardiology Office Note    Date:  04/20/2021   ID:  Brad Lee, DOB [birth year], MRN [id number]   PCP:  Henrine Screws, MD   [facility]  Cardiologist:  Lesleigh Noe, MD   Advanced Practice Provider:  No care team member to display Electrophysiologist:  None   [phone]   Chief Complaint  Patient presents with   Follow-up     History of Present Illness:  Brad Lee is a 45 y.o. male with history of AWMI 07/2017 followed by CABG x5 while on a business trip in [place], [place] EF 40-45%, systolic CHF, history of syncope related to meds and overdiuresis, HLD, family hx CAD  Patient saw Dr. Katrinka Blazing 12/19/19 and recommended f/u echo and if LV still down consider MRA and SGLT2. F/U echo never done.  Patient comes in for f/u. Was working full time at home and taking care of his toddler. Now she's in preschool so he hopes to start exercising again.Gets about 6000-8000 steps daily. Eating out a lot. Asking if he can take fluconazole for tinea versicolor flairs-insurance company stopped b/c of cardiac side effects(QT prolongation). Dr. Katrinka Blazing has approved in the past per patient.  Past Medical History:  Diagnosis Date   CAD (coronary artery disease)    a. 07/2017 s/p Ant STEMI/Cath [facility], [place]): LAD 100->attempt to open LAD failed, high grade dzs in LCX/RCA--->CABG x 5 (LIMA->LAD, VG->Diag, VG->OM, VG->PDA->RPL.   HFrEF (heart failure with reduced ejection fraction) (HCC)    a. 07/29/2017 Echo: EF 45%; b. 08/06/2017 TEE: EF 30-35%.   Ischemic cardiomyopathy    a. 07/29/2017 Echo: EF 45%; b. 08/06/2017 TEE: EF 30-35%-->LifeVest placed following CABG.   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   aspirin 81 MG chewable tablet Chew 1 tablet (81 mg total) by mouth daily. Pt. Needs to schedule an appt. In order to receive further refills. Thank you. 3rd attempt.   atorvastatin (LIPITOR) 40 MG tablet Take 1 tablet (40 mg total) by mouth daily. Please keep upcoming appt in October 2022 with Dr. Katrinka Blazing before anymore refills. Thank you Final Attempt   calcium carbonate (TUMS - DOSED IN MG ELEMENTAL CALCIUM) 500 MG chewable tablet Chew 1 tablet by mouth daily as needed for indigestion or heartburn.    cetirizine (ZYRTEC) 10 MG tablet Take 10 mg by mouth daily as needed for allergies.    famotidine (PEPCID) 10 MG tablet Take 10 mg by mouth daily as needed for heartburn or indigestion.    fluticasone (FLONASE) 50 MCG/ACT nasal spray Place 1 spray into both nostrils daily as needed for allergies.    metoprolol succinate (TOPROL-XL) 50 MG 24 hr tablet TAKE 1 TABLET (50 MG TOTAL) BY MOUTH DAILY. TAKE WITH OR IMMEDIATELY FOLLOWING A MEAL. PLEASE KEEP UPCOMING APPT IN JUNE WITH DR. Katrinka Blazing. THANK YOU   Multiple Vitamin (MULTIVITAMIN) tablet Take 1 tablet by mouth daily.   nitroGLYCERIN (NITROSTAT) 0.4 MG SL tablet PLACE 1 TABLET UNDER THE TONGUE EVERY  5 MINS FOR 3 DOSES AS NEEDED FOR CHEST PAIN   sacubitril-valsartan (ENTRESTO) 24-26 MG Take 1 tablet by mouth 2 (two) times daily. Please make overdue appt with Dr. Katrinka Blazing before anymore refills. Thank you 1st attempt   VASCEPA 1 g capsule TAKE 2 CAPSULES (2 G TOTAL) BY MOUTH 2 (TWO) TIMES DAILY.     Allergies:   Augmentin [amoxicillin-pot clavulanate]   Social History   Socioeconomic History   Marital status: Married    Spouse name: Not on file   Number of children: 0   Years of education: Not on file   Highest education level: Not on file  Occupational History   Occupation: OPERATIONS  MANAGER  Tobacco Use   Smoking status: Never   Smokeless tobacco: Never  Vaping Use   Vaping Use: Never used  Substance and Sexual Activity   Alcohol use: No   Drug use: No   Sexual activity: Not Currently  Other Topics Concern   Not on file  Social History Narrative   Not on file   Social Determinants of Health   Financial Resource Strain: Not on file  Food Insecurity: Not on file  Transportation Needs: Not on file  Physical Activity: Not on file  Stress: Not on file  Social Connections: Not on file     Family History:  The patient's  family history includes Healthy in his father and mother.   ROS:   Please see the history of present illness.    ROS All other systems reviewed and are negative.   PHYSICAL EXAM:   VS:  BP 128/80   Pulse 65   Ht 6' 1.5" (1.867 m)   Wt 235 lb 6.4 oz (106.8 kg)   SpO2 94%   BMI 30.64 kg/m   Physical Exam  GEN: Well nourished, well developed, in no acute distress  Neck: no JVD, carotid bruits, or masses Cardiac:RRR; no murmurs, rubs, or gallops  Respiratory:  clear to auscultation bilaterally, normal work of breathing GI: soft, nontender, nondistended, + BS Ext: without cyanosis, clubbing, or edema, Good distal pulses bilaterally Neuro:  Alert and Oriented x 3 Psych: euthymic mood, full affect  Wt Readings from Last 3 Encounters:  04/20/21 235 lb 6.4 oz (106.8 kg)  12/19/19 228 lb (103.4 kg)  01/11/19 230 lb (104.3 kg)      Studies/Labs Reviewed:   EKG:  EKG is  ordered today.  The ekg ordered today demonstrates NSR with old AWMI. No change  Recent Labs: 06/19/2020: BUN 13; Creatinine, Ser 1.10; Potassium 4.0; Sodium 138   Lipid Panel    Component Value Date/Time   CHOL 127 02/26/2019 0943   TRIG 154 (H) 02/26/2019 0943   HDL 35 (L) 02/26/2019 0943   CHOLHDL 3.6 02/26/2019 0943   LDLCALC 61 02/26/2019 0943    Additional studies/ records that were reviewed today include:  Echo 07/08/19  IMPRESSIONS     1. Left  ventricular ejection fraction, by visual estimation, is 40 to  45%. The left ventricle has normal function. There is no left ventricular  hypertrophy.   2. The left ventricle demonstrates regional wall motion abnormalities.   3. Mild dyskinesis of the left ventricular, apical anterior wall,  anteroseptal wall, anterolateral wall and apical segment.   4. Elevated left atrial pressure.   5. Left ventricular diastolic parameters are consistent with Grade II  diastolic dysfunction (pseudonormalization).   6. Right ventricular volume/pressure overload.   7. Global right ventricle has normal systolic function.The right  ventricular size is normal. No increase in right ventricular wall  thickness.   8. Left atrial size was normal.   9. Right atrial size was normal.  10. The mitral valve is normal in structure. Mild mitral valve  regurgitation. No evidence of mitral stenosis.  11. The tricuspid valve is normal in structure. Tricuspid valve  regurgitation is trivial.  12. The aortic valve is normal in structure. Aortic valve regurgitation is  not visualized. No evidence of aortic valve sclerosis or stenosis.  13. Pulmonic regurgitation is mild.  14. The pulmonic valve was normal in structure. Pulmonic valve  regurgitation is mild.  15. Normal pulmonary artery systolic pressure.  16. The inferior vena cava is normal in size with greater than 50%  respiratory variability, suggesting right atrial pressure of 3 mmHg.  17. No significant change since 10/20/2017.  18. Prior images reviewed side by side.   Risk Assessment/Calculations:         ASSESSMENT:    1. Coronary artery disease involving coronary bypass graft of native heart with angina pectoris (HCC)   2. Ischemic cardiomyopathy   3. Chronic systolic CHF (congestive heart failure) (HCC)   4. Hyperlipidemia with target LDL less than 70      PLAN:  In order of problems listed above:  CAD S/P ant STEMI followed by CABG x 5  07/2017-no angina, 150 min exercise daily on ASA, lipitor, toprol  ICM LVEF 40-45% on echo 07/08/19 on entresto, toprol. Repeat echo.  Systolic CHF compensated  HLD-LDL 77 and trig 221 on lipitor 40 mg daily and vascepa(sometimes miss one of these doses). Working on diet and exercise so won't increase meds today. Goal LDL <70  Tinea versicolor treated with fluconazole in the past but insurance denied b/c of cardiac problems and risk of QT prolongation and interaction with atorvastatin Our pharmacy reviewed and ok this in the past. Can take 1/2 of atorvastatin while on fluconazole. QT similar to last year and patient only takes it for flairs.  Shared Decision Making/Informed Consent        Medication Adjustments/Labs and Tests Ordered: Current medicines are reviewed at length with the patient today.  Concerns regarding medicines are outlined above.  Medication changes, Labs and Tests ordered today are listed in the Patient Instructions below. Patient Instructions  Medication Instructions:  Your physician recommends that you continue on your current medications as directed. Please refer to the Current Medication list given to you today.  *If you need a refill on your cardiac medications before your next appointment, please call your pharmacy*   Lab Work: None If you have labs (blood work) drawn today and your tests are completely normal, you will receive your results only by: MyChart Message (if you have MyChart) OR A paper copy in the mail If you have any lab test that is abnormal or we need to change your treatment, we will call you to review the results.   Testing/Procedures: Your physician has requested that you have an echocardiogram. Echocardiography is a painless test that uses sound waves to create images of your heart. It provides your doctor with information about the size and shape of your heart and how well your heart's chambers and valves are working. This procedure takes  approximately one hour. There are no restrictions for this procedure.  Follow-Up: At [facility], you and your health needs are our priority.  As part of our continuing mission to provide you with exceptional heart care, we have created designated Provider  Care Teams.  These Care Teams include your primary Cardiologist (physician) and Advanced Practice Providers (APPs -  Physician Assistants and Nurse Practitioners) who all work together to provide you with the care you need, when you need it.  Your next appointment:   1 year(s)  The format for your next appointment:   In Person  Provider:   You may see Lesleigh Noe, MD or one of the following Advanced Practice Providers on your designated Care Team:   Nada Boozer, NP   Other Instructions Your provider recommends that you maintain 150 minutes per week of moderate aerobic activity.     Elson Clan, PA-C  04/20/2021 10:31 AM    [facility] [address] [place], [place], [place]  [address] Phone: [phone]; Fax: [phone]

## 2021-04-20 NOTE — Patient Instructions (Signed)
Medication Instructions:  Your physician recommends that you continue on your current medications as directed. Please refer to the Current Medication list given to you today.  *If you need a refill on your cardiac medications before your next appointment, please call your pharmacy*   Lab Work: None If you have labs (blood work) drawn today and your tests are completely normal, you will receive your results only by: MyChart Message (if you have MyChart) OR A paper copy in the mail If you have any lab test that is abnormal or we need to change your treatment, we will call you to review the results.   Testing/Procedures: Your physician has requested that you have an echocardiogram. Echocardiography is a painless test that uses sound waves to create images of your heart. It provides your doctor with information about the size and shape of your heart and how well your heart's chambers and valves are working. This procedure takes approximately one hour. There are no restrictions for this procedure.  Follow-Up: At [facility], you and your health needs are our priority.  As part of our continuing mission to provide you with exceptional heart care, we have created designated Provider Care Teams.  These Care Teams include your primary Cardiologist (physician) and Advanced Practice Providers (APPs -  Physician Assistants and Nurse Practitioners) who all work together to provide you with the care you need, when you need it.  Your next appointment:   1 year(s)  The format for your next appointment:   In Person  Provider:   You may see Lesleigh Noe, MD or one of the following Advanced Practice Providers on your designated Care Team:   Nada Boozer, NP   Other Instructions Your provider recommends that you maintain 150 minutes per week of moderate aerobic activity.

## 2021-04-25 ENCOUNTER — Other Ambulatory Visit: Payer: Self-pay | Admitting: Interventional Cardiology

## 2021-04-26 ENCOUNTER — Other Ambulatory Visit: Payer: Self-pay | Admitting: Interventional Cardiology

## 2021-04-27 MED ORDER — ENTRESTO 24-26 MG PO TABS: 1.0000 | ORAL_TABLET | Freq: Two times a day (BID) | ORAL | 3 refills | Status: DC

## 2021-05-07 ENCOUNTER — Other Ambulatory Visit: Payer: Self-pay | Admitting: Interventional Cardiology

## 2021-05-21 ENCOUNTER — Other Ambulatory Visit (HOSPITAL_COMMUNITY): Payer: Managed Care, Other (non HMO)

## 2021-05-21 ENCOUNTER — Ambulatory Visit (HOSPITAL_COMMUNITY): Payer: Managed Care, Other (non HMO)

## 2021-05-25 ENCOUNTER — Other Ambulatory Visit: Payer: Self-pay | Admitting: Interventional Cardiology

## 2021-06-08 ENCOUNTER — Other Ambulatory Visit (HOSPITAL_COMMUNITY): Payer: Managed Care, Other (non HMO)

## 2021-06-25 ENCOUNTER — Ambulatory Visit (HOSPITAL_COMMUNITY): Payer: Managed Care, Other (non HMO) | Attending: Physician Assistant

## 2021-06-25 ENCOUNTER — Other Ambulatory Visit: Payer: Self-pay

## 2021-06-25 DIAGNOSIS — I255 Ischemic cardiomyopathy: Secondary | ICD-10-CM | POA: Diagnosis present

## 2021-06-25 LAB — ECHOCARDIOGRAM COMPLETE
Area-P 1/2: 3.71 cm2
S' Lateral: 3.9 cm

## 2021-06-25 MED ORDER — PERFLUTREN LIPID MICROSPHERE
1.0000 mL | INTRAVENOUS | Status: AC | PRN
  Administered 2021-06-25: 2 mL via INTRAVENOUS

## 2021-07-02 ENCOUNTER — Other Ambulatory Visit: Payer: Self-pay

## 2021-07-02 DIAGNOSIS — I5022 Chronic systolic (congestive) heart failure: Secondary | ICD-10-CM

## 2021-07-02 MED ORDER — DAPAGLIFLOZIN PROPANEDIOL 10 MG PO TABS: 10.0000 mg | ORAL_TABLET | Freq: Every day | ORAL | 3 refills | Status: DC

## 2021-07-22 ENCOUNTER — Other Ambulatory Visit: Payer: Managed Care, Other (non HMO)

## 2021-08-04 ENCOUNTER — Other Ambulatory Visit: Payer: Self-pay

## 2021-08-04 ENCOUNTER — Encounter: Payer: Self-pay | Admitting: Interventional Cardiology

## 2021-08-04 ENCOUNTER — Other Ambulatory Visit: Payer: Managed Care, Other (non HMO) | Admitting: *Deleted

## 2021-08-04 DIAGNOSIS — I5022 Chronic systolic (congestive) heart failure: Secondary | ICD-10-CM

## 2021-08-04 LAB — BASIC METABOLIC PANEL
BUN/Creatinine Ratio: 10 (ref 9–20)
BUN: 12 mg/dL (ref 6–24)
CO2: 21 mmol/L (ref 20–29)
Calcium: 9.3 mg/dL (ref 8.7–10.2)
Chloride: 103 mmol/L (ref 96–106)
Creatinine, Ser: 1.18 mg/dL (ref 0.76–1.27)
Glucose: 97 mg/dL (ref 70–99)
Potassium: 4 mmol/L (ref 3.5–5.2)
Sodium: 138 mmol/L (ref 134–144)
eGFR: 79 mL/min/{1.73_m2} (ref >59)

## 2021-08-04 NOTE — Telephone Encounter (Signed)
error 

## 2021-08-05 ENCOUNTER — Other Ambulatory Visit: Payer: Managed Care, Other (non HMO)

## 2021-10-30 ENCOUNTER — Other Ambulatory Visit: Payer: Self-pay | Admitting: Physician Assistant

## 2021-11-01 NOTE — Telephone Encounter (Signed)
Ok to fill.  Looks like this was started in December after his echo.  ?

## 2021-11-11 ENCOUNTER — Other Ambulatory Visit: Payer: Self-pay | Admitting: Interventional Cardiology

## 2021-11-18 ENCOUNTER — Encounter: Payer: Self-pay | Admitting: Interventional Cardiology

## 2022-02-20 ENCOUNTER — Other Ambulatory Visit: Payer: Self-pay | Admitting: Interventional Cardiology

## 2022-03-01 ENCOUNTER — Other Ambulatory Visit: Payer: Self-pay | Admitting: Interventional Cardiology

## 2022-04-03 ENCOUNTER — Other Ambulatory Visit: Payer: Self-pay | Admitting: Interventional Cardiology

## 2022-04-16 ENCOUNTER — Other Ambulatory Visit: Payer: Self-pay | Admitting: Interventional Cardiology

## 2022-04-29 NOTE — Progress Notes (Signed)
Cardiology Office Note:    Date:  05/02/2022   ID:  Brad Lee, DOB Jul 22, 1975, MRN [id number]  PCP:  Aura Dials, MD  Cardiologist:  Sinclair Grooms, MD   Referring MD: Aura Dials, MD   Chief Complaint  Patient presents with   Coronary Artery Disease   Congestive Heart Failure   Hyperlipidemia    History of Present Illness:    Brad Lee is a 46 y.o. male with a hx of anterior myocardial infarction January 1771, acute systolic heart failure, ultimate multivessel coronary bypass grafting January 2019, history of syncope related to medication and overdiuresis, family history CAD, and hyperlipidemia.  He denies angina, orthopnea, PND, lower extremity swelling, medication side effects, and syncope.  Job schedule, a young daughter whom he is responsible for getting to and from school, and recent job change have made it difficult to exercise and to sleep well.  Past Medical History:  Diagnosis Date   CAD (coronary artery disease)    a. 07/2017 s/p Ant STEMI/Cath [facility], [place]): LAD 100->attempt to open LAD failed, high grade dzs in LCX/RCA--->CABG x 5 (LIMA->LAD, VG->Diag, VG->OM, VG->PDA->RPL.   HFrEF (heart failure with reduced ejection fraction) ([place])    a. 07/29/2017 Echo: EF 45%; b. 08/06/2017 TEE: EF 30-35%.   Ischemic cardiomyopathy    a. 07/29/2017 Echo: EF 45%; b. 08/06/2017 TEE: EF 30-35%-->LifeVest placed following CABG.   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   aspirin ([employer] ASPIRIN ADULT LOW DOSE) 81 MG chewable tablet Chew 1  tablet (81 mg total) by mouth daily.   atorvastatin (LIPITOR) 40 MG tablet TAKE 1 TABLET BY MOUTH EVERY DAY   calcium carbonate (TUMS - DOSED IN MG ELEMENTAL CALCIUM) 500 MG chewable tablet Chew 1 tablet by mouth daily as needed for indigestion or heartburn.    cetirizine (ZYRTEC) 10 MG tablet Take 10 mg by mouth daily as needed for allergies.    ENTRESTO 24-26 MG TAKE 1 TABLET BY MOUTH TWICE A DAY   famotidine (PEPCID) 10 MG tablet Take 10 mg by mouth daily as needed for heartburn or indigestion.    FARXIGA 10 MG TABS tablet TAKE 1 TABLET BY MOUTH EVERY DAY BEFORE BREAKFAST   fluticasone (FLONASE) 50 MCG/ACT nasal spray Place 1 spray into both nostrils daily as needed for allergies.    metoprolol succinate (TOPROL-XL) 50 MG 24 hr tablet Take 1 tablet (50 mg total) by mouth daily. Please keep upcoming appt.with Dr. Tamala Julian in order to receive anymore refills. Thank You.   Multiple Vitamin (MULTIVITAMIN) tablet Take 1 tablet by mouth daily.   nitroGLYCERIN (NITROSTAT) 0.4 MG SL tablet PLACE 1 TABLET UNDER THE TONGUE EVERY 5 MINS FOR 3 DOSES AS NEEDED FOR CHEST PAIN   spironolactone (ALDACTONE) 25 MG tablet Take 0.5 tablets (12.5 mg total) by mouth 3 (three) times a week. Take on Mondays, Wednesdays, and Fridays.   VASCEPA 1 g capsule TAKE 2 CAPSULES BY MOUTH TWICE A DAY     Allergies:   Augmentin [  amoxicillin-pot clavulanate]   Social History   Socioeconomic History   Marital status: Married    Spouse name: Not on file   Number of children: 0   Years of education: Not on file   Highest education level: Not on file  Occupational History   Occupation: OPERATIONS MANAGER  Tobacco Use   Smoking status: Never   Smokeless tobacco: Never  Vaping Use   Vaping Use: Never used  Substance and Sexual Activity   Alcohol use: No   Drug use: No   Sexual activity: Not Currently  Other Topics Concern   Not on file  Social History Narrative   Not on file   Social Determinants of Health    Financial Resource Strain: Not on file  Food Insecurity: Not on file  Transportation Needs: Not on file  Physical Activity: Not on file  Stress: Not on file  Social Connections: Not on file     Family History: The patient's family history includes Healthy in his father and mother.  ROS:   Please see the history of present illness.    Difficulty sleeping.  Feels he is overworked.  Stress related to work load and family life responsibilities.  All other systems reviewed and are negative.  EKGs/Labs/Other Studies Reviewed:    The following studies were reviewed today:  2 D Doppler ECHOCARDIOGRAM 06/2021: IMPRESSIONS   1. Acoustic windows are difficult, esp at apex. Endocardium not well seen  Overall there appears to be hypokinesis / akinesis of distal lateral,  distal anterior, distal septal and apical walls . Overall not  significantly changed from echo in Dec 2020  (though imaging was better in that previous study). Left ventricular  ejection fraction, by estimation, is 40%. The left ventricle has  moderately decreased function.   2. Right ventricular systolic function is normal. The right ventricular  size is normal. There is normal pulmonary artery systolic pressure.   3. The mitral valve is normal in structure. Mild mitral valve  regurgitation.   4. The aortic valve is tricuspid. Aortic valve regurgitation is not  visualized. Aortic valve sclerosis is present, with no evidence of aortic  valve stenosis.   5. The inferior vena cava is normal in size with greater than 50%  respiratory variability, suggesting right atrial pressure of 3 mmHg.   EKG:  EKG sinus rhythm, first-degree AV block (212 ms).  Poor R wave progression V1 through the 5.  Q waves also in 1 and aVL.  Occasional PVC.  When compared to the tracing performed in October 2022, it there are no changes other than the PVC is noted today and not before.  Recent Labs: 08/04/2021: BUN 12; Creatinine, Ser 1.18;  Potassium 4.0; Sodium 138  Recent Lipid Panel    Component Value Date/Time   CHOL 127 02/26/2019 0943   TRIG 154 (H) 02/26/2019 0943   HDL 35 (L) 02/26/2019 0943   CHOLHDL 3.6 02/26/2019 0943   LDLCALC 61 02/26/2019 0943    Physical Exam:    VS:  BP 118/74   Pulse 70   Ht 6' 1.5" (1.867 m)   Wt 236 lb 6.4 oz (107.2 kg)   SpO2 96%   BMI 30.77 kg/m     Wt Readings from Last 3 Encounters:  05/02/22 236 lb 6.4 oz (107.2 kg)  04/20/21 235 lb 6.4 oz (106.8 kg)  12/19/19 228 lb (103.4 kg)     GEN: Overweight. No acute distress HEENT: Normal NECK: No JVD. LYMPHATICS: No lymphadenopathy CARDIAC:  No murmur. RRR S4 gallop, or edema. VASCULAR:  Normal Pulses. No bruits. RESPIRATORY:  Clear to auscultation without rales, wheezing or rhonchi  ABDOMEN: Soft, non-tender, non-distended, No pulsatile mass, MUSCULOSKELETAL: No deformity  SKIN: Warm and dry NEUROLOGIC:  Alert and oriented x 3 PSYCHIATRIC:  Normal affect   ASSESSMENT:    1. Chronic systolic heart failure ([place])   2. Coronary artery disease involving coronary bypass graft of native heart with angina pectoris ([place])   3. Ischemic cardiomyopathy   4. Hyperlipidemia with target LDL less than 70    PLAN:    In order of problems listed above:  Will round out is systolic heart failure medication regimen by adding low-dose spironolactone 12.5 mg on Monday, Wednesday, and Friday. C-metabolic panel in 7 to 14 days.  He should notify us of lightheadedness or dizziness. Encouraged aggressive secondary prevention as outlined below.  I still have a suspicion that he may have sleep apnea.  He may need a sleep study. We will repeat a 2D Doppler echocardiogram at least a month prior to the switch over to Dr. Irish Lack. Continue high intensity statin therapy.  A lipid panel and c-Met will be done in 7 to 14 days.  Overall education and awareness concerning secondary risk prevention was discussed in detail: LDL less than 70, hemoglobin  A1c less than 7, blood pressure target less than 130/80 mmHg, >150 minutes of moderate aerobic activity per week, avoidance of smoking, weight control (via diet and exercise), and continued surveillance/management of/for obstructive sleep apnea.  Guideline directed therapy for left ventricular systolic dysfunction: Angiotensin receptor-neprilysin inhibitor (ARNI)-Entresto; beta-blocker therapy - carvedilol, metoprolol succinate, or bisoprolol; mineralocorticoid receptor antagonist (MRA) therapy -spironolactone or eplerenone.  SGLT-2 agents -  Dapagliflozin Wilder Glade) or Empagliflozin (Jardiance).These therapies have been shown to improve clinical outcomes including reduction of rehospitalization, survival, and acute heart failure.   Medication Adjustments/Labs and Tests Ordered: Current medicines are reviewed at length with the patient today.  Concerns regarding medicines are outlined above.  Orders Placed This Encounter  Procedures   Lipid panel   Comprehensive metabolic panel   EKG [id number]   ECHOCARDIOGRAM COMPLETE   Meds ordered this encounter  Medications   spironolactone (ALDACTONE) 25 MG tablet    Sig: Take 0.5 tablets (12.5 mg total) by mouth 3 (three) times a week. Take on Mondays, Wednesdays, and Fridays.    Dispense:  45 tablet    Refill:  3    Patient Instructions  Medication Instructions:  Your physician has recommended you make the following change in your medication:   1) START spironolactone 12.5mg  three times weekly on Mondays, Wednesdays, and Fridays.  *If you need a refill on your cardiac medications before your next appointment, please call your pharmacy*  Lab Work: In 2-3 weeks: CMET and Lipid panel (fasting) If you have labs (blood work) drawn today and your tests are completely normal, you will receive your results only by: [place] (if you have MyChart) OR A paper copy in the mail If you have any lab test that is abnormal or we need to change your  treatment, we will call you to review the results.  Testing/Procedures: Your physician has requested that you have an echocardiogram in March 2024. Echocardiography is a painless test that uses sound waves to create images of your heart. It provides your doctor with information about the size and shape of your heart and how well your heart's chambers and valves are working. This procedure takes approximately one hour. There  are no restrictions for this procedure. Please do NOT wear cologne, perfume, aftershave, or lotions (deodorant is allowed). Please arrive 15 minutes prior to your appointment time.  Follow-Up: At [facility], you and your health needs are our priority.  As part of our continuing mission to provide you with exceptional heart care, we have created designated Provider Care Teams.  These Care Teams include your primary Cardiologist (physician) and Advanced Practice Providers (APPs -  Physician Assistants and Nurse Practitioners) who all work together to provide you with the care you need, when you need it.  Your next appointment:   6 month(s)  The format for your next appointment:   In Person  Provider:   Larae Grooms, MD  Important Information About Sugar         Signed, Sinclair Grooms, MD  05/02/2022 11:19 AM    [place]

## 2022-05-02 ENCOUNTER — Ambulatory Visit: Payer: Managed Care, Other (non HMO) | Attending: Interventional Cardiology | Admitting: Interventional Cardiology

## 2022-05-02 ENCOUNTER — Encounter: Payer: Self-pay | Admitting: Interventional Cardiology

## 2022-05-02 VITALS — BP 118/74 | HR 70 | Ht 73.5 in | Wt 236.4 lb

## 2022-05-02 DIAGNOSIS — I25709 Atherosclerosis of coronary artery bypass graft(s), unspecified, with unspecified angina pectoris: Secondary | ICD-10-CM | POA: Diagnosis not present

## 2022-05-02 DIAGNOSIS — E785 Hyperlipidemia, unspecified: Secondary | ICD-10-CM | POA: Diagnosis not present

## 2022-05-02 DIAGNOSIS — I5022 Chronic systolic (congestive) heart failure: Secondary | ICD-10-CM

## 2022-05-02 DIAGNOSIS — I255 Ischemic cardiomyopathy: Secondary | ICD-10-CM | POA: Diagnosis not present

## 2022-05-02 MED ORDER — SPIRONOLACTONE 25 MG PO TABS: 12.5000 mg | ORAL_TABLET | ORAL | 3 refills | Status: DC

## 2022-05-02 NOTE — Patient Instructions (Addendum)
Medication Instructions:  Your physician has recommended you make the following change in your medication:   1) START spironolactone 12.5mg  three times weekly on Mondays, Wednesdays, and Fridays.  *If you need a refill on your cardiac medications before your next appointment, please call your pharmacy*  Lab Work: In 2-3 weeks: CMET and Lipid panel (fasting) If you have labs (blood work) drawn today and your tests are completely normal, you will receive your results only by: [place] (if you have MyChart) OR A paper copy in the mail If you have any lab test that is abnormal or we need to change your treatment, we will call you to review the results.  Testing/Procedures: Your physician has requested that you have an echocardiogram in March 2024. Echocardiography is a painless test that uses sound waves to create images of your heart. It provides your doctor with information about the size and shape of your heart and how well your heart's chambers and valves are working. This procedure takes approximately one hour. There are no restrictions for this procedure. Please do NOT wear cologne, perfume, aftershave, or lotions (deodorant is allowed). Please arrive 15 minutes prior to your appointment time.  Follow-Up: At [facility], you and your health needs are our priority.  As part of our continuing mission to provide you with exceptional heart care, we have created designated Provider Care Teams.  These Care Teams include your primary Cardiologist (physician) and Advanced Practice Providers (APPs -  Physician Assistants and Nurse Practitioners) who all work together to provide you with the care you need, when you need it.  Your next appointment:   6 month(s)  The format for your next appointment:   In Person  Provider:   Larae Grooms, MD  Important Information About Sugar

## 2022-05-16 ENCOUNTER — Other Ambulatory Visit: Payer: Managed Care, Other (non HMO)

## 2022-05-17 ENCOUNTER — Other Ambulatory Visit: Payer: Self-pay | Admitting: Interventional Cardiology

## 2022-05-27 ENCOUNTER — Ambulatory Visit: Payer: Managed Care, Other (non HMO) | Attending: Interventional Cardiology

## 2022-05-27 DIAGNOSIS — I25709 Atherosclerosis of coronary artery bypass graft(s), unspecified, with unspecified angina pectoris: Secondary | ICD-10-CM

## 2022-05-27 DIAGNOSIS — I5022 Chronic systolic (congestive) heart failure: Secondary | ICD-10-CM

## 2022-05-27 DIAGNOSIS — E785 Hyperlipidemia, unspecified: Secondary | ICD-10-CM

## 2022-05-28 LAB — COMPREHENSIVE METABOLIC PANEL
ALT: 26 IU/L (ref 0–44)
AST: 20 IU/L (ref 0–40)
Albumin/Globulin Ratio: 1.6 (ref 1.2–2.2)
Albumin: 4.2 g/dL (ref 4.1–5.1)
Alkaline Phosphatase: 72 IU/L (ref 44–121)
BUN/Creatinine Ratio: 13 (ref 9–20)
BUN: 16 mg/dL (ref 6–24)
Bilirubin Total: 0.7 mg/dL (ref 0.0–1.2)
CO2: 26 mmol/L (ref 20–29)
Calcium: 8.9 mg/dL (ref 8.7–10.2)
Chloride: 104 mmol/L (ref 96–106)
Creatinine, Ser: 1.24 mg/dL (ref 0.76–1.27)
Globulin, Total: 2.6 g/dL (ref 1.5–4.5)
Glucose: 83 mg/dL (ref 70–99)
Potassium: 4.2 mmol/L (ref 3.5–5.2)
Sodium: 142 mmol/L (ref 134–144)
Total Protein: 6.8 g/dL (ref 6.0–8.5)
eGFR: 74 mL/min/{1.73_m2} (ref >59)

## 2022-05-28 LAB — LIPID PANEL
Chol/HDL Ratio: 3.1 ratio (ref 0.0–5.0)
Cholesterol, Total: 136 mg/dL (ref 100–199)
HDL: 44 mg/dL (ref >39)
LDL Chol Calc (NIH): 64 mg/dL (ref 0–99)
Triglycerides: 167 mg/dL — ABNORMAL HIGH (ref 0–149)
VLDL Cholesterol Cal: 28 mg/dL (ref 5–40)

## 2022-07-09 ENCOUNTER — Other Ambulatory Visit: Payer: Self-pay | Admitting: Interventional Cardiology

## 2022-08-04 ENCOUNTER — Other Ambulatory Visit: Payer: Self-pay | Admitting: Interventional Cardiology

## 2022-09-19 ENCOUNTER — Other Ambulatory Visit: Payer: Self-pay

## 2022-09-19 MED ORDER — METOPROLOL SUCCINATE ER 50 MG PO TB24: 50.0000 mg | ORAL_TABLET | Freq: Every day | ORAL | 2 refills | Status: DC

## 2022-09-26 ENCOUNTER — Other Ambulatory Visit (HOSPITAL_COMMUNITY): Payer: Managed Care, Other (non HMO)

## 2022-10-10 ENCOUNTER — Other Ambulatory Visit: Payer: Self-pay | Admitting: *Deleted

## 2022-10-10 MED ORDER — VASCEPA 1 G PO CAPS: 2.0000 g | ORAL_CAPSULE | Freq: Two times a day (BID) | ORAL | 0 refills | Status: DC

## 2022-10-10 MED ORDER — ATORVASTATIN CALCIUM 40 MG PO TABS: 40.0000 mg | ORAL_TABLET | Freq: Every day | ORAL | 0 refills | Status: DC

## 2022-10-24 ENCOUNTER — Ambulatory Visit (HOSPITAL_COMMUNITY): Payer: Managed Care, Other (non HMO)

## 2022-11-30 ENCOUNTER — Ambulatory Visit (HOSPITAL_COMMUNITY): Payer: Managed Care, Other (non HMO)

## 2022-11-30 ENCOUNTER — Other Ambulatory Visit: Payer: Self-pay | Admitting: Interventional Cardiology

## 2022-12-30 ENCOUNTER — Ambulatory Visit (HOSPITAL_COMMUNITY): Payer: Managed Care, Other (non HMO)

## 2023-01-11 ENCOUNTER — Other Ambulatory Visit: Payer: Self-pay | Admitting: Interventional Cardiology

## 2023-01-24 ENCOUNTER — Ambulatory Visit (HOSPITAL_COMMUNITY): Payer: Managed Care, Other (non HMO)

## 2023-02-16 ENCOUNTER — Ambulatory Visit (HOSPITAL_COMMUNITY): Payer: Managed Care, Other (non HMO) | Attending: Interventional Cardiology

## 2023-02-16 DIAGNOSIS — I5022 Chronic systolic (congestive) heart failure: Secondary | ICD-10-CM | POA: Insufficient documentation

## 2023-02-16 LAB — ECHOCARDIOGRAM COMPLETE
Area-P 1/2: 3.54 cm2
S' Lateral: 3.7 cm

## 2023-02-16 MED ORDER — PERFLUTREN LIPID MICROSPHERE
3.0000 mL | INTRAVENOUS | Status: AC | PRN
Start: 2023-02-16 — End: 2023-02-16
  Administered 2023-02-16: 3 mL via INTRAVENOUS

## 2023-03-16 NOTE — Progress Notes (Signed)
Cardiology Office Note:    Date:  03/17/2023  ID:  Brad Lee, DOB 10-02-1975, MRN [id number] PCP: Henrine Screws, MD  [facility] Providers Cardiologist:  Donato Schultz, MD       Patient Profile:       Problems: Coronary artery disease  Ant STEMI in 07/2017 s/p CABG [facility] [place]) (HFrEF) heart failure with reduced ejection fraction  Ischemic CM; EF 35-40 Hx of syncope due to meds/overdiuresis Hyperlipidemia  FHx CAD  CV Studies: TTE 02/16/23: EF 35-40, no RWMA, mod LVH, apical/apical ant/inf-lat/sept/lat AK, NL RVSF, mild MR, AV sclerosis, RAP 3          History of Present Illness  Discussed the use of AI scribe software for clinical note transcription with the patient, who gave verbal consent to proceed.    A 47 year old male with a history of CAD and heart failure with reduced ejection fraction secondary to ischemic cardiomyopathy returns for a follow-up visit. The patient's history includes an anterior STEMI in 2019, managed with CABG. The patient's ejection fraction is 35-40% as per the most recent echocardiogram in August 2024.  The patient reports no chest discomfort or pain. The patient also reports no swelling in his legs and no shortness of breath. He is trying to get back to exercising. This has been s/w difficult while caring for his 68 year old daughter.      ROS:  See HPI    Studies Reviewed:   EKG Interpretation Date/Time:  Friday March 17 2023 08:49:27 EDT Ventricular Rate:  66 PR Interval:  196 QRS Duration:  90 QT Interval:  442 QTC Calculation: 463 R Axis:   260  Text Interpretation: Normal sinus rhythm Anterolateral infarct Right axis deviation TW inversions V3-6 No significant change since last tracing Confirmed by Tereso Newcomer [phone]) on 03/17/2023 11:06:35 AM    Risk Assessment/Calculations:             Physical Exam:   VS:  BP 116/82 (BP Location: Right Arm, Patient Position: Sitting, Cuff Size: Large)   Pulse 66   Ht 6' 1.5" (1.867 m)    Wt 236 lb (107 kg)   SpO2 95%   BMI 30.71 kg/m    Wt Readings from Last 3 Encounters:  03/17/23 236 lb (107 kg)  05/02/22 236 lb 6.4 oz (107.2 kg)  04/20/21 235 lb 6.4 oz (106.8 kg)    Constitutional:      Appearance: Healthy appearance. Not in distress.  Neck:     Vascular: JVD normal.  Pulmonary:     Breath sounds: Normal breath sounds. No wheezing. No rales.  Cardiovascular:     Normal rate. Regular rhythm.     Murmurs: There is no murmur.  Edema:    Peripheral edema absent.  Abdominal:     Palpations: Abdomen is soft.      Assessment and Plan:  No problem-specific Assessment & Plan notes found for this encounter. Assessment and Plan    Coronary Artery Disease (CAD) History of anterior STEMI in 2019 while in [facility] managed with multivessel CABG. No current symptoms of angina. -Continue aspirin 81mg  daily, atorvastatin 40mg  daily, Toprol XL 50mg  daily, and nitroglycerin PRN. -F/u 1 year.  Heart Failure with Reduced Ejection Fraction (HFrEF) EF 35-40% secondary to ischemic cardiomyopathy. NYHA class II. Volume status stable.  -Continue Farxiga 10mg  daily, Toprol XL 50mg  daily, Entresto 24/26mg  BID, and spironolactone 12.5mg  daily.  Hyperlipidemia LDL 64, slightly above goal of <55.  -Continue atorvastatin 40mg  daily  and Vascepa 2g BID. -Check CMET, lipid panel at next office visit.         Dispo:  Return in 17 weeks (on 07/14/2023) for Scheduled Follow Up w/ Dr. Anne Fu.  Signed, Tereso Newcomer, PA-C

## 2023-03-17 ENCOUNTER — Ambulatory Visit: Payer: Managed Care, Other (non HMO) | Attending: Physician Assistant | Admitting: Physician Assistant

## 2023-03-17 ENCOUNTER — Encounter: Payer: Self-pay | Admitting: Physician Assistant

## 2023-03-17 VITALS — BP 116/82 | HR 66 | Ht 73.5 in | Wt 236.0 lb

## 2023-03-17 DIAGNOSIS — I5020 Unspecified systolic (congestive) heart failure: Secondary | ICD-10-CM

## 2023-03-17 DIAGNOSIS — E785 Hyperlipidemia, unspecified: Secondary | ICD-10-CM

## 2023-03-17 DIAGNOSIS — I25709 Atherosclerosis of coronary artery bypass graft(s), unspecified, with unspecified angina pectoris: Secondary | ICD-10-CM | POA: Diagnosis not present

## 2023-03-17 NOTE — Patient Instructions (Signed)
Medication Instructions:  Your physician recommends that you continue on your current medications as directed. Please refer to the Current Medication list given to you today.  *If you need a refill on your cardiac medications before your next appointment, please call your pharmacy*   Lab Work: TO BE DONE ON 07/14/23: CMET, FASTING LIPIDS  If you have labs (blood work) drawn today and your tests are completely normal, you will receive your results only by: MyChart Message (if you have MyChart) OR A paper copy in the mail If you have any lab test that is abnormal or we need to change your treatment, we will call you to review the results.   Testing/Procedures: NONE   Follow-Up: At [facility], you and your health needs are our priority.  As part of our continuing mission to provide you with exceptional heart care, we have created designated Provider Care Teams.  These Care Teams include your primary Cardiologist (physician) and Advanced Practice Providers (APPs -  Physician Assistants and Nurse Practitioners) who all work together to provide you with the care you need, when you need it.  We recommend signing up for the patient portal called "MyChart".  Sign up information is provided on this After Visit Summary.  MyChart is used to connect with patients for Virtual Visits (Telemedicine).  Patients are able to view lab/test results, encounter notes, upcoming appointments, etc.  Non-urgent messages can be sent to your provider as well.   To learn more about what you can do with MyChart, go to [email or web address].    Your next appointment:   KEEP SCHEDULED FOLLOW-UP

## 2023-03-31 ENCOUNTER — Ambulatory Visit
Admission: RE | Admit: 2023-03-31 | Discharge: 2023-03-31 | Disposition: A | Discharge status: Home / Self Care | Payer: Managed Care, Other (non HMO) | Source: Ambulatory Visit | Attending: Family Medicine | Admitting: Family Medicine

## 2023-03-31 ENCOUNTER — Ambulatory Visit
Admission: RE | Admit: 2023-03-31 | Discharge: 2023-03-31 | Disposition: A | Discharge status: Home / Self Care | Payer: Managed Care, Other (non HMO) | Attending: Family Medicine | Admitting: Family Medicine

## 2023-03-31 ENCOUNTER — Other Ambulatory Visit: Payer: Self-pay | Admitting: Family Medicine

## 2023-03-31 DIAGNOSIS — R042 Hemoptysis: Secondary | ICD-10-CM

## 2023-04-10 ENCOUNTER — Other Ambulatory Visit: Payer: Self-pay | Admitting: Interventional Cardiology

## 2023-04-11 ENCOUNTER — Other Ambulatory Visit (HOSPITAL_COMMUNITY): Payer: Self-pay

## 2023-04-11 ENCOUNTER — Other Ambulatory Visit: Payer: Self-pay | Admitting: Cardiology

## 2023-04-11 NOTE — Telephone Encounter (Signed)
Can patient get generic? If so generic is covered without a PA. 90 day supply is $27.50 with [facility].

## 2023-05-04 ENCOUNTER — Other Ambulatory Visit: Payer: Self-pay

## 2023-05-04 MED ORDER — SPIRONOLACTONE 25 MG PO TABS: 12.5000 mg | ORAL_TABLET | ORAL | 3 refills | Status: DC

## 2023-05-12 ENCOUNTER — Other Ambulatory Visit (HOSPITAL_COMMUNITY): Payer: Self-pay

## 2023-05-12 ENCOUNTER — Telehealth: Payer: Self-pay | Admitting: Pharmacy Technician

## 2023-05-12 NOTE — Telephone Encounter (Signed)
Pharmacy Patient Advocate Encounter   Received notification from CoverMyMeds that prior authorization for icosapent is required/requested.   Insurance verification completed.   The patient is insured through [facility] ADVANTAGE/RX ADVANCE .   Per test claim: The current 05/12/23 day co-pay is, $27.50- 3 month supply.  No PA needed at this time. This test claim was processed through [facility]- copay amounts may vary at other pharmacies due to pharmacy/plan contracts, or as the patient moves through the different stages of their insurance plan.

## 2023-06-13 ENCOUNTER — Other Ambulatory Visit: Payer: Self-pay | Admitting: Physician Assistant

## 2023-06-27 ENCOUNTER — Telehealth: Payer: Self-pay | Admitting: Cardiology

## 2023-06-27 NOTE — Telephone Encounter (Signed)
Pt c/o medication issue:  1. Name of Medication:   icosapent Ethyl (VASCEPA) 1 g capsule   2. How are you currently taking this medication (dosage and times per day)? As prescribed  3. Are you having a reaction (difficulty breathing--STAT)?   No  4. What is your medication issue?   Patient stated he has always been getting the name brand Vasepa medication and at his last refill they gave him generic and he wants to know if he was specifically prescribed the generic or can he still get the name brand.

## 2023-06-27 NOTE — Telephone Encounter (Signed)
*  STAT* If patient is at the pharmacy, call can be transferred to refill team.   1. Which medications need to be refilled? (please list name of each medication and dose if known)   dapagliflozin propanediol (FARXIGA) 10 MG TABS tablet   2. Would you like to learn more about the convenience, safety, & potential cost savings by using the [facility]?   3. Are you open to using the [facility] (Type [facility]. ).  4. Which pharmacy/location (including street and city if local pharmacy) is medication to be sent to?  [id number] - [place], [place] - [address]   5. Do they need a 30 day or 90 day supply?   90 day  Patient stated he is completely out of this medication.

## 2023-06-28 MED ORDER — DAPAGLIFLOZIN PROPANEDIOL 10 MG PO TABS: 10.0000 mg | ORAL_TABLET | Freq: Every day | ORAL | 2 refills | Status: DC

## 2023-06-28 NOTE — Telephone Encounter (Signed)
Spoke with pt RE: generic vascepa vs name brand.  Advised this office has not changed how we refill the med but the pharmacy may have changed based on their protocols or on his insurance coverage.  Pt reports he will try using the generic for now but will call back if further questions or concerns.

## 2023-07-13 ENCOUNTER — Other Ambulatory Visit: Payer: Self-pay | Admitting: Cardiology

## 2023-07-14 ENCOUNTER — Ambulatory Visit: Payer: Managed Care, Other (non HMO) | Attending: Cardiology | Admitting: Cardiology

## 2023-07-14 ENCOUNTER — Other Ambulatory Visit: Payer: Managed Care, Other (non HMO)

## 2023-07-14 ENCOUNTER — Encounter: Payer: Self-pay | Admitting: Cardiology

## 2023-07-14 ENCOUNTER — Other Ambulatory Visit: Payer: Self-pay | Admitting: *Deleted

## 2023-07-14 VITALS — BP 126/84 | HR 72 | Ht 73.5 in | Wt 238.6 lb

## 2023-07-14 DIAGNOSIS — Z79899 Other long term (current) drug therapy: Secondary | ICD-10-CM | POA: Diagnosis not present

## 2023-07-14 DIAGNOSIS — I5022 Chronic systolic (congestive) heart failure: Secondary | ICD-10-CM

## 2023-07-14 DIAGNOSIS — I25709 Atherosclerosis of coronary artery bypass graft(s), unspecified, with unspecified angina pectoris: Secondary | ICD-10-CM | POA: Diagnosis not present

## 2023-07-14 DIAGNOSIS — E785 Hyperlipidemia, unspecified: Secondary | ICD-10-CM

## 2023-07-14 DIAGNOSIS — I5020 Unspecified systolic (congestive) heart failure: Secondary | ICD-10-CM

## 2023-07-14 LAB — CBC

## 2023-07-14 NOTE — Patient Instructions (Signed)
Medication Instructions:  The current medical regimen is effective;  continue present plan and medications.  *If you need a refill on your cardiac medications before your next appointment, please call your pharmacy*   Lab Work: Please have blood work at your closest [facility] (CBC, CMP and Lipid)  If you have labs (blood work) drawn today and your tests are completely normal, you will receive your results only by: MyChart Message (if you have MyChart) OR A paper copy in the mail If you have any lab test that is abnormal or we need to change your treatment, we will call you to review the results.  Follow-Up: At [facility], you and your health needs are our priority.  As part of our continuing mission to provide you with exceptional heart care, we have created designated Provider Care Teams.  These Care Teams include your primary Cardiologist (physician) and Advanced Practice Providers (APPs -  Physician Assistants and Nurse Practitioners) who all work together to provide you with the care you need, when you need it.  We recommend signing up for the patient portal called "MyChart".  Sign up information is provided on this After Visit Summary.  MyChart is used to connect with patients for Virtual Visits (Telemedicine).  Patients are able to view lab/test results, encounter notes, upcoming appointments, etc.  Non-urgent messages can be sent to your provider as well.   To learn more about what you can do with MyChart, go to [email or web address].    Your next appointment:   1 year(s)  Provider:   Tereso Newcomer, PA-C

## 2023-07-14 NOTE — Progress Notes (Signed)
Cardiology Office Note:  .   Date:  07/14/2023  ID:  Brad Lee, DOB 1975-10-01, MRN [id number] PCP: Henrine Screws, MD  [facility] Providers Cardiologist:  Donato Schultz, MD     History of Present Illness: .   Brad Lee is a 47 y.o. male Discussed with the use of AI scribe software  History of Present Illness   The patient is a 47 year old male ([employer] grad 2001, [employer]) with a history of coronary artery disease, anterior STEMI in 2019, and subsequent coronary artery bypass grafting (CABG) in [place]. The patient's ejection fraction is reported to be 35-40%, indicating ischemic cardiomyopathy. NYHA 1. The patient is currently on a regimen of Farxiga, Toprol XL, Entresto, spironolactone, icosapent ethyl, and atorvastatin. The patient's LDL was 64 and triglycerides were 167 as of the last check on 05/27/22. The patient's creatinine was mildly elevated at 1.24, and potassium was 4.2. The last echocardiogram on 02/16/23 showed an ejection fraction of 35-40% with moderate left ventricular hypertrophy.  The patient reports feeling generally well, with no significant complaints related to his cardiac condition. The patient acknowledges the need for more physical activity but cites time constraints due to work and family responsibilities. The patient has a six-year-old child and does not have a family history of heart disease.  The patient recounts a significant event of having a heart attack while in [facility] for work. The patient describes experiencing a burning sensation in the chest, initially dismissed as indigestion. The symptoms persisted, leading to hospitalization and the diagnosis of a heart attack. The patient underwent catheterization, which revealed a "100% blockage", necessitating bypass surgery. The patient remained fully conscious throughout the experience and recalls the events in detail.  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 The patient acknowledges the need for more exercise and has various exercise equipment at home. 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 Despite these challenges, the patient remains optimistic and is actively seeking ways to improve his health and lifestyle.           Studies Reviewed: .        Results   LABS LDL: 64 (05/27/2022) Triglycerides: 167 (05/27/2022) Creatinine: 1.24 (05/27/2022) Potassium: 4.2 (05/27/2022)  DIAGNOSTIC Echocardiogram: Ejection fraction 35-40%, moderate LVH (02/16/2023)     Risk Assessment/Calculations:            Physical Exam:   VS:  BP 126/84   Pulse 72   Ht 6' 1.5" (1.867 m)   Wt 238 lb 9.6 oz (108.2 kg)   SpO2 97%   BMI 31.05 kg/m    Wt Readings from Last 3 Encounters:  07/14/23 238 lb 9.6 oz (108.2 kg)  03/17/23 236 lb (107 kg)  05/02/22 236 lb 6.4 oz (107.2 kg)    GEN: Well nourished, well developed in no acute distress NECK: No JVD; No carotid bruits CARDIAC: RRR, no murmurs, no rubs, no gallops RESPIRATORY:  Clear to auscultation without rales, wheezing or rhonchi  ABDOMEN: Soft, non-tender, non-distended EXTREMITIES:  No edema; No deformity   ASSESSMENT AND PLAN: .    Assessment and Plan    Ischemic Cardiomyopathy Chronic condition with an ejection fraction of 35-40% and moderate LVH. Well-managed on Farxiga, Toprol XL, Entresto, and spironolactone. No recent exacerbations or hospitalizations. Patient reports stable symptoms but needs more exercise. Discussed benefits of regular physical activity for cardiovascular health. Patient has home  exercise equipment but struggles with time due to a busy schedule. - Continue Farxiga 10 mg daily, Toprol XL 50 mg daily, Entresto 24/26 mg twice daily, spironolactone 12.5 mg three times a week - Encourage 30 minutes of daily physical activity - Order lipid panel, C-Met, and CBC -  Follow-up with Tereso Newcomer, PA, in one year  Coronary Artery Disease (CAD) Anterior STEMI in 2019, status post-CABG. Managed with atorvastatin and icosapent ethyl. LDL at 64 mg/dL, triglycerides at 213 mg/dL. Discussed maintaining lipid levels to prevent further cardiac events. - Continue atorvastatin 40 mg daily - Continue icosapent ethyl 2 grams twice daily - Monitor lipid levels regularly  Mildly Elevated Creatinine Creatinine at 1.24, indicating mild renal impairment. Potassium at 4.2, within normal range. Discussed need for regular renal function monitoring. - Monitor renal function regularly - Check kidney function in upcoming blood work  General Health Maintenance Advised to maintain regular physical activity and manage work-life balance to reduce stress. Discussed strategies to incorporate exercise into daily routine despite a busy schedule. - Encourage regular physical activity - Advise on stress management and work-life balance  Follow-up - Follow-up with Tereso Newcomer, PA, in one year.              Signed, Donato Schultz, MD

## 2023-07-15 LAB — CBC
Hematocrit: 51.7 % — ABNORMAL HIGH (ref 37.5–51.0)
Hemoglobin: 16.9 g/dL (ref 13.0–17.7)
MCH: 26.6 pg (ref 26.6–33.0)
MCHC: 32.7 g/dL (ref 31.5–35.7)
MCV: 81 fL (ref 79–97)
Platelets: 244 10*3/uL (ref 150–450)
RBC: 6.35 x10E6/uL — ABNORMAL HIGH (ref 4.14–5.80)
RDW: 14.5 % (ref 11.6–15.4)
WBC: 6.1 10*3/uL (ref 3.4–10.8)

## 2023-07-15 LAB — COMPREHENSIVE METABOLIC PANEL
ALT: 23 [IU]/L (ref 0–44)
AST: 24 [IU]/L (ref 0–40)
Albumin: 4.4 g/dL (ref 4.1–5.1)
Alkaline Phosphatase: 81 [IU]/L (ref 44–121)
BUN/Creatinine Ratio: 10 (ref 9–20)
BUN: 11 mg/dL (ref 6–24)
Bilirubin Total: 0.7 mg/dL (ref 0.0–1.2)
CO2: 23 mmol/L (ref 20–29)
Calcium: 9.3 mg/dL (ref 8.7–10.2)
Chloride: 103 mmol/L (ref 96–106)
Creatinine, Ser: 1.1 mg/dL (ref 0.76–1.27)
Globulin, Total: 2.7 g/dL (ref 1.5–4.5)
Glucose: 82 mg/dL (ref 70–99)
Potassium: 4.2 mmol/L (ref 3.5–5.2)
Sodium: 140 mmol/L (ref 134–144)
Total Protein: 7.1 g/dL (ref 6.0–8.5)
eGFR: 84 mL/min/{1.73_m2} (ref >59)

## 2023-07-15 LAB — LIPID PANEL
Chol/HDL Ratio: 3.8 {ratio} (ref 0.0–5.0)
Cholesterol, Total: 146 mg/dL (ref 100–199)
HDL: 38 mg/dL — ABNORMAL LOW (ref >39)
LDL Chol Calc (NIH): 87 mg/dL (ref 0–99)
Triglycerides: 115 mg/dL (ref 0–149)
VLDL Cholesterol Cal: 21 mg/dL (ref 5–40)

## 2023-08-04 ENCOUNTER — Encounter: Payer: Self-pay | Admitting: Cardiology

## 2023-08-06 ENCOUNTER — Other Ambulatory Visit: Payer: Self-pay | Admitting: Cardiology

## 2023-08-09 ENCOUNTER — Telehealth: Payer: Self-pay | Admitting: Cardiology

## 2023-08-09 MED ORDER — DAPAGLIFLOZIN PROPANEDIOL 10 MG PO TABS: 10.0000 mg | ORAL_TABLET | Freq: Every day | ORAL | 3 refills | Status: DC

## 2023-08-09 MED ORDER — ICOSAPENT ETHYL 1 G PO CAPS: 2.0000 g | ORAL_CAPSULE | Freq: Two times a day (BID) | ORAL | 3 refills | Status: AC

## 2023-08-09 MED ORDER — NITROGLYCERIN 0.4 MG SL SUBL: 0.4000 mg | SUBLINGUAL_TABLET | SUBLINGUAL | 3 refills | Status: DC | PRN

## 2023-08-09 MED ORDER — METOPROLOL SUCCINATE ER 50 MG PO TB24: 50.0000 mg | ORAL_TABLET | Freq: Every day | ORAL | 3 refills | Status: AC

## 2023-08-09 MED ORDER — ATORVASTATIN CALCIUM 40 MG PO TABS: 40.0000 mg | ORAL_TABLET | Freq: Every day | ORAL | 3 refills | Status: DC

## 2023-08-09 MED ORDER — SPIRONOLACTONE 25 MG PO TABS: 12.5000 mg | ORAL_TABLET | ORAL | 3 refills | Status: AC

## 2023-08-09 MED ORDER — ASPIRIN 81 MG PO CHEW: 81.0000 mg | CHEWABLE_TABLET | Freq: Every day | ORAL | 3 refills | Status: AC

## 2023-08-09 MED ORDER — ENTRESTO 24-26 MG PO TABS: 1.0000 | ORAL_TABLET | Freq: Two times a day (BID) | ORAL | 3 refills | Status: DC

## 2023-08-09 NOTE — Telephone Encounter (Signed)
*  STAT* If patient is at the pharmacy, call can be transferred to refill team.   1. Which medications need to be refilled? (please list name of each medication and dose if known) sacubitril-valsartan (ENTRESTO) 24-26 MG   2. Which pharmacy/location (including street and city if local pharmacy) is medication to be sent to? [id number] - [place], [place] - [address] [phone]  3. Do they need a 30 day or 90 day supply? 90 Pt would also like any other meds that are filled by our dr sent to pharmacy due to last day on job being this Friday.

## 2023-08-11 ENCOUNTER — Encounter: Payer: Self-pay | Admitting: *Deleted

## 2023-08-14 MED ORDER — ROSUVASTATIN CALCIUM 40 MG PO TABS: 40.0000 mg | ORAL_TABLET | Freq: Every day | ORAL | 3 refills | Status: DC

## 2023-08-14 MED ORDER — EZETIMIBE 10 MG PO TABS: 10.0000 mg | ORAL_TABLET | Freq: Every day | ORAL | 3 refills | Status: AC

## 2023-08-17 ENCOUNTER — Other Ambulatory Visit: Payer: Self-pay | Admitting: Cardiology

## 2023-09-03 ENCOUNTER — Other Ambulatory Visit: Payer: Self-pay | Admitting: Cardiology

## 2023-09-07 ENCOUNTER — Ambulatory Visit
Admission: EM | Admit: 2023-09-07 | Discharge: 2023-09-07 | Disposition: A | Discharge status: Home / Self Care | Payer: Managed Care, Other (non HMO) | Attending: Family Medicine | Admitting: Family Medicine

## 2023-09-07 DIAGNOSIS — J069 Acute upper respiratory infection, unspecified: Secondary | ICD-10-CM | POA: Diagnosis not present

## 2023-09-07 LAB — POC COVID19/FLU A&B COMBO
Covid Antigen, POC: NEGATIVE
Influenza A Antigen, POC: NEGATIVE
Influenza B Antigen, POC: NEGATIVE

## 2023-09-07 NOTE — ED Provider Notes (Signed)
[facility]    CSN: [id number] Arrival date & time: 09/07/23  1733      History   Chief Complaint Chief Complaint  Patient presents with   Nasal Congestion   Fatigue    HPI Brad Lee is a 48 y.o. male.   HPI Here for cough and nasal congestion and postnasal drainage.  No fever.  Symptoms began 3 or 4 days ago.  His daughter tested positive for RSV and his wife's had something viral also.  He has a history of heart trouble and would like any testing he can have here today.  He is allergic to Augmentin which causes a high heart rate.  Past Medical History:  Diagnosis Date   CAD (coronary artery disease)    a. 07/2017 s/p Ant STEMI/Cath [facility], [place]): LAD 100->attempt to open LAD failed, high grade dzs in LCX/RCA--->CABG x 5 (LIMA->LAD, VG->Diag, VG->OM, VG->PDA->RPL.   HFrEF (heart failure with reduced ejection fraction) (HCC)    a. 07/29/2017 Echo: EF 45%; b. 08/06/2017 TEE: EF 30-35%.   Ischemic cardiomyopathy    a. 07/29/2017 Echo: EF 45%; b. 08/06/2017 TEE: EF 30-35%-->LifeVest placed following CABG.   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   Syncope     Patient Active Problem List   Diagnosis Date Noted   Syncope 08/18/2017   HFrEF (heart failure with reduced ejection fraction) (HCC) 08/18/2017   Hyperlipidemia with target LDL less than 70 08/18/2017   Postoperative anemia 08/18/2017   Ischemic cardiomyopathy 08/18/2017   Coronary artery disease involving coronary bypass graft of native heart with angina pectoris (HCC) 08/18/2017    Past Surgical History:  Procedure Laterality Date   CORONARY ARTERY BYPASS GRAFT  07/28/2017   TONSILLECTOMY         Home Medications    Prior to Admission medications   Medication Sig Start Date End Date Taking? Authorizing Provider  aspirin ([facility] ASPIRIN ADULT LOW DOSE) 81 MG chewable tablet Chew 1 tablet (81 mg total) by  mouth daily. 08/09/23  Yes Jake Bathe, MD  Azelastine HCl 137 MCG/SPRAY SOLN Place 1 puff into both nostrils 2 (two) times daily. 10/19/22  Yes [provider]  calcium carbonate (TUMS - DOSED IN MG ELEMENTAL CALCIUM) 500 MG chewable tablet Chew 1 tablet by mouth daily as needed for indigestion or heartburn.    Yes [provider]  cetirizine (ZYRTEC) 10 MG tablet Take 10 mg by mouth daily as needed for allergies.    Yes [provider]  dapagliflozin propanediol (FARXIGA) 10 MG TABS tablet Take 1 tablet (10 mg total) by mouth daily. 08/09/23  Yes Jake Bathe, MD  ezetimibe (ZETIA) 10 MG tablet Take 1 tablet (10 mg total) by mouth daily. 08/14/23  Yes Jake Bathe, MD  famotidine (PEPCID) 10 MG tablet Take 10 mg by mouth daily as needed for heartburn or indigestion.    Yes [provider]  icosapent Ethyl (VASCEPA) 1 g capsule Take 2 capsules (2 g total) by mouth 2 (two) times daily. 08/09/23  Yes Jake Bathe, MD  metoprolol succinate (TOPROL-XL) 50 MG 24 hr tablet Take 1 tablet (50 mg total) by mouth daily. 08/09/23  Yes Jake Bathe, MD  Multiple Vitamin (MULTIVITAMIN) tablet Take 1 tablet by mouth daily.   Yes [provider]  rosuvastatin (CRESTOR) 40 MG tablet Take 1 tablet (40 mg total)  by mouth daily. 08/14/23  Yes Jake Bathe, MD  sacubitril-valsartan (ENTRESTO) 24-26 MG Take 1 tablet by mouth 2 (two) times daily. 08/09/23  Yes Jake Bathe, MD  spironolactone (ALDACTONE) 25 MG tablet Take 0.5 tablets (12.5 mg total) by mouth 3 (three) times a week. Take on Mondays, Wednesdays, and Fridays. 08/09/23  Yes Jake Bathe, MD  Vitamin D, Ergocalciferol, (DRISDOL) 1.25 MG (50000 UNIT) CAPS capsule Take 50,000 Units by mouth every 7 (seven) days. "I do this in a Multi Vitamin, same dose" 08/15/17  Yes [provider]  albuterol (PROVENTIL HFA;VENTOLIN HFA) 108 (90 Base) MCG/ACT inhaler Inhale 2 puffs into the lungs every 6 (six) hours as  needed for wheezing or shortness of breath.    [provider]  nitroGLYCERIN (NITROSTAT) 0.4 MG SL tablet PLACE 1 TABLET UNDER THE TONGUE EVERY 5 MINUTES AS NEEDED FOR CHEST PAIN. 09/05/23   Jake Bathe, MD    Family History Family History  Problem Relation Age of Onset   Healthy Mother    Healthy Father     Social History Social History   Tobacco Use   Smoking status: Never   Smokeless tobacco: Never  Vaping Use   Vaping status: Never Used  Substance Use Topics   Alcohol use: No   Drug use: No     Allergies   Amoxicillin-pot clavulanate   Review of Systems Review of Systems   Physical Exam Triage Vital Signs ED Triage Vitals  Encounter Vitals Group     BP 09/07/23 1809 112/71     Systolic BP Percentile --      Diastolic BP Percentile --      Pulse Rate 09/07/23 1809 86     Resp 09/07/23 1809 20     Temp 09/07/23 1809 98.1 F (36.7 C)     Temp Source 09/07/23 1809 Oral     SpO2 09/07/23 1809 96 %     Weight 09/07/23 1801 236 lb (107 kg)     Height 09/07/23 1801 6' 1.5" (1.867 m)     Head Circumference --      Peak Flow --      Pain Score 09/07/23 1801 0     Pain Loc --      Pain Education --      Exclude from Growth Chart --    No data found.  Updated Vital Signs BP 112/71 (BP Location: Left Arm)   Pulse 86   Temp 98.1 F (36.7 C) (Oral)   Resp 20   Ht 6' 1.5" (1.867 m)   Wt 107 kg   SpO2 96%   BMI 30.71 kg/m   Visual Acuity Right Eye Distance:   Left Eye Distance:   Bilateral Distance:    Right Eye Near:   Left Eye Near:    Bilateral Near:     Physical Exam Vitals reviewed.  Constitutional:      General: He is not in acute distress.    Appearance: He is not toxic-appearing.  HENT:     Right Ear: Tympanic membrane and ear canal normal.     Left Ear: Tympanic membrane and ear canal normal.     Nose: Congestion present.     Mouth/Throat:     Mouth: Mucous membranes are moist.     Pharynx: No oropharyngeal exudate or  posterior oropharyngeal erythema.  Eyes:     Extraocular Movements: Extraocular movements intact.     Conjunctiva/sclera: Conjunctivae normal.  Pupils: Pupils are equal, round, and reactive to light.  Cardiovascular:     Rate and Rhythm: Normal rate and regular rhythm.     Heart sounds: No murmur heard. Pulmonary:     Effort: Pulmonary effort is normal. No respiratory distress.     Breath sounds: Normal breath sounds. No stridor. No wheezing, rhonchi or rales.  Musculoskeletal:     Cervical back: Neck supple.  Lymphadenopathy:     Cervical: No cervical adenopathy.  Skin:    Capillary Refill: Capillary refill takes less than 2 seconds.     Coloration: Skin is not jaundiced or pale.  Neurological:     General: No focal deficit present.     Mental Status: He is alert and oriented to person, place, and time.  Psychiatric:        Behavior: Behavior normal.      UC Treatments / Results  Labs (all labs ordered are listed, but only abnormal results are displayed) Labs Reviewed  POC COVID19/FLU A&B COMBO - Normal    EKG   Radiology No results found.  Procedures Procedures (including critical care time)  Medications Ordered in UC Medications - No data to display  Initial Impression / Assessment and Plan / UC Course  I have reviewed the triage vital signs and the nursing notes.  Pertinent labs & imaging results that were available during my care of the patient were reviewed by me and considered in my medical decision making (see chart for details).     Flu and COVID test is negative today.  We discussed that he may have the RSV from his daughter.  We do not have point-of-care RSV testing in this clinic today, plus we cannot order it in his age group.  Tessalon Perles are sent in for cough.  We discussed Mucinex as needed. Final Clinical Impressions(s) / UC Diagnoses   Final diagnoses:  Viral URI     Discharge Instructions      Your flu and COVID test was  negative.  Take benzonatate 100 mg, 1 tab every 8 hours as needed for cough.  Mucinex is safe for you to take.     ED Prescriptions   None    PDMP not reviewed this encounter.   Zenia Resides, MD 09/07/23 Mikle Bosworth

## 2023-09-07 NOTE — ED Triage Notes (Signed)
"  This started a few days ago as nasal congestion, lots of drainage in my throat, my daughter tested + for RSV and my wife has had a sinus infection". No fever. "I would like any testing".

## 2023-09-07 NOTE — Discharge Instructions (Addendum)
Your flu and COVID test was negative.  Take benzonatate 100 mg, 1 tab every 8 hours as needed for cough.  Mucinex is safe for you to take.

## 2023-09-08 ENCOUNTER — Ambulatory Visit: Payer: Managed Care, Other (non HMO)

## 2023-09-17 ENCOUNTER — Other Ambulatory Visit: Payer: Self-pay | Admitting: Cardiology

## 2023-10-04 ENCOUNTER — Encounter: Payer: Self-pay | Admitting: Cardiology

## 2024-05-31 ENCOUNTER — Telehealth: Payer: Self-pay | Admitting: Cardiology

## 2024-05-31 ENCOUNTER — Other Ambulatory Visit (HOSPITAL_COMMUNITY): Payer: Self-pay

## 2024-05-31 NOTE — Telephone Encounter (Signed)
 Pt c/o medication issue:  1. Name of Medication:   dapagliflozin  propanediol (FARXIGA ) 10 MG TABS tablet    2. How are you currently taking this medication (dosage and times per day)? As written   3. Are you having a reaction (difficulty breathing--STAT)? No   4. What is your medication issue? Pt called in stating his new insurance will not pay for this med which is around $400. Not sure if it needs PA or med assistance, please advise.   Pt now has [employer].

## 2024-05-31 NOTE — Telephone Encounter (Signed)
 Pharmacy Patient Advocate Encounter  Medication requires PA for claim.    Received notification from Physician's Office that prior authorization for FARXIGA  is required/requested.   Insurance verification completed.   The patient is insured through [facility].   Per test claim: PA required; PA submitted to above mentioned insurance via Latent Key/confirmation #/EOC [facility] Status is pending

## 2024-05-31 NOTE — Telephone Encounter (Signed)
 Pharmacy Patient Advocate Encounter  Received notification from [facility] that Prior Authorization for FARXIGA  has been APPROVED from 05/31/24 to 05/31/25. Ran test claim, Copay is $15. This test claim was processed through [facility]- copay amounts may vary at other pharmacies due to pharmacy/plan contracts, or as the patient moves through the different stages of their insurance plan.

## 2024-08-02 ENCOUNTER — Encounter: Payer: Self-pay | Admitting: Cardiology

## 2024-08-05 ENCOUNTER — Other Ambulatory Visit: Payer: Self-pay | Admitting: Cardiology

## 2024-08-05 MED ORDER — SACUBITRIL-VALSARTAN 24-26 MG PO TABS: 1.0000 | ORAL_TABLET | Freq: Two times a day (BID) | ORAL | 3 refills | Status: AC

## 2024-08-06 ENCOUNTER — Other Ambulatory Visit: Payer: Self-pay | Admitting: Cardiology

## 2024-08-07 ENCOUNTER — Encounter: Payer: Self-pay | Admitting: Cardiology

## 2024-08-08 MED ORDER — DAPAGLIFLOZIN PROPANEDIOL 10 MG PO TABS: 10.0000 mg | ORAL_TABLET | Freq: Every day | ORAL | 3 refills | Status: AC

## 2024-08-12 MED ORDER — ROSUVASTATIN CALCIUM 40 MG PO TABS: 40.0000 mg | ORAL_TABLET | Freq: Every day | ORAL | 0 refills | Status: AC

## 2024-08-12 NOTE — Telephone Encounter (Signed)
 In accordance with refill protocols, please review and address the following requirements before this medication refill can be authorized:  Labs
